# Patient Record
Sex: Male | Born: 1970
Health system: Southern US, Community
[De-identification: ages and names within clinical notes are randomized; demographics above are authoritative.]

## PROBLEM LIST (undated history)

## (undated) DIAGNOSIS — J302 Other seasonal allergic rhinitis: Secondary | ICD-10-CM

## (undated) DIAGNOSIS — K219 Gastro-esophageal reflux disease without esophagitis: Secondary | ICD-10-CM

## (undated) HISTORY — PX: HEMORRHOID SURGERY: SHX153

---

## 2007-12-21 ENCOUNTER — Encounter (INDEPENDENT_AMBULATORY_CARE_PROVIDER_SITE_OTHER): Payer: Self-pay | Admitting: General Surgery

## 2007-12-21 ENCOUNTER — Ambulatory Visit (HOSPITAL_COMMUNITY): Admission: RE | Admit: 2007-12-21 | Discharge: 2007-12-21 | Payer: Self-pay | Admitting: General Surgery

## 2010-07-29 NOTE — Op Note (Signed)
NAME:  Carl Zhang, Carl Zhang               ACCOUNT NO.:  [id number]   MEDICAL RECORD NO.:  [id number]          PATIENT TYPE:  AMB   LOCATION:  DAY                          FACILITY:  [facility]   PHYSICIAN:  Almond Lint, MD       DATE OF BIRTH:  [birth year]   DATE OF PROCEDURE:  12/21/2007  DATE OF DISCHARGE:                               OPERATIVE REPORT   PREOPERATIVE DIAGNOSIS:  External hemorrhoids.   POSTOPERATIVE DIAGNOSIS:  External hemorrhoids.   PROCEDURE PERFORMED:  Excision of external hemorrhoids x2 and excision  of rectal tag.   SURGEON:  Almond Lint, M.D.   ASSISTANT:  None.   ANESTHESIA:  General and local.   FINDINGS:  Two large external hemorrhoids and a rectal tag.   SPECIMENS:  Hemorrhoids and rectal tag, to pathology.   ESTIMATED BLOOD LOSS:  Minimal.   COMPLICATIONS:  None.   PROCEDURE:  [name] was identified in the holding area and taken to  the operating room, where he was placed supine on the operating room  table.  General anesthesia was induced.  He was placed in a lithotomy  position.  A time-out was performed with the surgical check list.  All  was correct, so we proceeded.  His anus was first examined digitally and  then with the anoscope.  There were minimal internal hemorrhoids but two  large external hemorrhoids and one small external hemorrhoid.  There is  also a rectal tag around 2 cm into the anus.  The hemorrhoids were  excised with the Bovie electrocautery.  Both defects were reasonably  good-sized, so these were closed with 3-0 chromic in a figure-of-eight  fashion.  The rectal tag was also removed with the Bovie.  This defect  was not closed, as it was small in size.  The area was found not to be  bleeding and was injected with local anesthesia.  This was 1% lidocaine  with epinephrine and 0.25% Marcaine plain mixed in a 1:1 fashion.  The  sphincter was anesthetized as well as under the sites of  hemorrhoidectomy.  A roll of Gelfoam with  lidocaine jelly was placed  inside the anus.  The anus was then cleaned, dried, and dressed with  gauze, ABDs, and mesh underwear.  Patient was awakened from anesthesia  and taken to the PACU in stable condition.     Almond Lint, MD  Electronically Signed    FB/MEDQ  D:  12/21/2007  T:  12/21/2007  Job:  [id number]

## 2015-05-13 ENCOUNTER — Other Ambulatory Visit: Payer: Self-pay | Admitting: Orthopedic Surgery

## 2015-05-17 ENCOUNTER — Encounter (HOSPITAL_BASED_OUTPATIENT_CLINIC_OR_DEPARTMENT_OTHER): Payer: Self-pay | Admitting: *Deleted

## 2015-05-20 NOTE — H&P (Signed)
Carl Zhang is an 45 y.o. male.   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  He went to see his primary care provider secondary to numbness and tingling that he was experiencing distal to the puncture wound.  He reports that this numbness and tingling makes it very difficult for him to feel the proper amount of pressure to apply when playing the cello.  He arrives today at the request of his primary care provider to further evaluate and treat his left small finger.  Past Medical History  Diagnosis Date  . Seasonal allergies     Past Surgical History  Procedure Laterality Date  . Hemorrhoid surgery      History reviewed. No pertinent family history. Social History:  reports that he has never smoked. He does not have any smokeless tobacco history on file. He reports that he does not drink alcohol or use illicit drugs.  Allergies: No Known Allergies  No prescriptions prior to admission    No results found for this or any previous visit (from the past 48 hour(s)). No results found.  Review of Systems  All other systems reviewed and are negative.   Height 6\' 3"  (1.905 m), weight 97.523 kg (215 lb). Physical Exam  Constitutional:  WD, WN, NAD HEENT:  NCAT, EOMI Neuro/Psych:  Alert & oriented to person, place, and time; appropriate mood & affect Lymphatic: No generalized UE edema or lymphadenopathy Extremities / MSK:  Both UE are normal with respect to appearance, ranges of motion, joint stability, muscle strength/tone, sensation, & perfusion except as otherwise noted:  There is an ulnar sided puncture wound just distal to the PIP with no signs of infection.  There is significant altered sensibility on the ulnar aspect of the left small finger, but none on the radial aspect.  2 point  discrimination on the radial aspect of the finger is 5 mm versus no ability to discriminate 2 points on the ulnar aspect.  The patient does have full digital range of motion.  Labs / Xrays: No radiographic studies obtained today.  Assessment:  Probable left small finger ulnar digital nerve laceration  Plan:  The findings were discussed at length with the patient.  After initially waiting several days, he ccontacted me indicating that the sensibility had not changed, and he wishes to proceed with surgical exploration and repair.  The details of the operative procedure were discussed with the patient.  Questions were invited and answered.  In addition to the goal of the procedure, the risks of the procedure to include but not limited to bleeding; infection; damage to the nerves or blood vessels that could result in bleeding, numbness, weakness, chronic pain, and the need for additional procedures; stiffness; the need for revision surgery; and anesthetic risks, were reviewed.  No specific outcome was guaranteed or implied.  Informed consent was obtained.   Ayani Ospina A., MD 05/20/2015, 1:27 PM

## 2015-05-21 ENCOUNTER — Ambulatory Visit (HOSPITAL_BASED_OUTPATIENT_CLINIC_OR_DEPARTMENT_OTHER)
Admission: RE | Admit: 2015-05-21 | Discharge: 2015-05-21 | Disposition: A | Discharge status: Home / Self Care | Payer: 59 | Source: Ambulatory Visit | Attending: Orthopedic Surgery | Admitting: Orthopedic Surgery

## 2015-05-21 ENCOUNTER — Ambulatory Visit (HOSPITAL_BASED_OUTPATIENT_CLINIC_OR_DEPARTMENT_OTHER): Payer: 59 | Admitting: Certified Registered"

## 2015-05-21 ENCOUNTER — Encounter (HOSPITAL_BASED_OUTPATIENT_CLINIC_OR_DEPARTMENT_OTHER): Payer: Self-pay | Admitting: Certified Registered"

## 2015-05-21 ENCOUNTER — Encounter (HOSPITAL_BASED_OUTPATIENT_CLINIC_OR_DEPARTMENT_OTHER)
Admission: RE | Disposition: A | Discharge status: Home / Self Care | Payer: Self-pay | Source: Ambulatory Visit | Attending: Orthopedic Surgery

## 2015-05-21 DIAGNOSIS — J302 Other seasonal allergic rhinitis: Secondary | ICD-10-CM | POA: Diagnosis not present

## 2015-05-21 DIAGNOSIS — S64497A Injury of digital nerve of left little finger, initial encounter: Secondary | ICD-10-CM | POA: Diagnosis present

## 2015-05-21 HISTORY — PX: NERVE REPAIR: SHX2083

## 2015-05-21 HISTORY — DX: Other seasonal allergic rhinitis: J30.2

## 2015-05-21 SURGERY — REPAIR, NERVE
Anesthesia: General | Site: Finger | Laterality: Left

## 2015-05-21 MED ORDER — BUPIVACAINE-EPINEPHRINE 0.5% -1:200000 IJ SOLN
INTRAMUSCULAR | Status: DC | PRN
  Administered 2015-05-21: 9 mL

## 2015-05-21 MED ORDER — ONDANSETRON HCL 4 MG/2ML IJ SOLN
INTRAMUSCULAR | Status: DC | PRN
  Administered 2015-05-21: 4 mg via INTRAVENOUS

## 2015-05-21 MED ORDER — LIDOCAINE HCL (CARDIAC) 20 MG/ML IV SOLN
INTRAVENOUS | Status: DC | PRN
  Administered 2015-05-21: 60 mg via INTRAVENOUS

## 2015-05-21 MED ORDER — ONDANSETRON HCL 4 MG/2ML IJ SOLN
INTRAMUSCULAR | Status: AC
  Filled 2015-05-21: qty 2

## 2015-05-21 MED ORDER — CEFAZOLIN SODIUM-DEXTROSE 2-3 GM-% IV SOLR
INTRAVENOUS | Status: AC
  Filled 2015-05-21: qty 50

## 2015-05-21 MED ORDER — GLYCOPYRROLATE 0.2 MG/ML IJ SOLN
0.2000 mg | Freq: Once | INTRAMUSCULAR | Status: DC | PRN
Start: 2015-05-21 — End: 2015-05-21

## 2015-05-21 MED ORDER — DEXAMETHASONE SODIUM PHOSPHATE 10 MG/ML IJ SOLN
INTRAMUSCULAR | Status: AC
  Filled 2015-05-21: qty 1

## 2015-05-21 MED ORDER — BUPIVACAINE-EPINEPHRINE (PF) 0.5% -1:200000 IJ SOLN
INTRAMUSCULAR | Status: AC
  Filled 2015-05-21: qty 30

## 2015-05-21 MED ORDER — CEFAZOLIN SODIUM-DEXTROSE 2-3 GM-% IV SOLR
2.0000 g | INTRAVENOUS | Status: AC
  Administered 2015-05-21: 2 g via INTRAVENOUS

## 2015-05-21 MED ORDER — FENTANYL CITRATE (PF) 100 MCG/2ML IJ SOLN
INTRAMUSCULAR | Status: AC
  Filled 2015-05-21: qty 2

## 2015-05-21 MED ORDER — FENTANYL CITRATE (PF) 100 MCG/2ML IJ SOLN
50.0000 ug | INTRAMUSCULAR | Status: DC | PRN
  Administered 2015-05-21: 100 ug via INTRAVENOUS

## 2015-05-21 MED ORDER — OXYCODONE HCL 5 MG/5ML PO SOLN: 5.0000 mg | Freq: Once | ORAL | Status: DC | PRN

## 2015-05-21 MED ORDER — LACTATED RINGERS IV SOLN: INTRAVENOUS | Status: DC

## 2015-05-21 MED ORDER — HYDROCODONE-ACETAMINOPHEN 5-325 MG PO TABS: 1.0000 | ORAL_TABLET | Freq: Four times a day (QID) | ORAL | Status: DC | PRN

## 2015-05-21 MED ORDER — LIDOCAINE HCL (CARDIAC) 20 MG/ML IV SOLN
INTRAVENOUS | Status: AC
  Filled 2015-05-21: qty 5

## 2015-05-21 MED ORDER — HYDROMORPHONE HCL 1 MG/ML IJ SOLN
0.2500 mg | INTRAMUSCULAR | Status: DC | PRN
  Administered 2015-05-21: 0.5 mg via INTRAVENOUS

## 2015-05-21 MED ORDER — MIDAZOLAM HCL 2 MG/2ML IJ SOLN
1.0000 mg | INTRAMUSCULAR | Status: DC | PRN
  Administered 2015-05-21: 2 mg via INTRAVENOUS

## 2015-05-21 MED ORDER — BUPIVACAINE HCL (PF) 0.5 % IJ SOLN
INTRAMUSCULAR | Status: AC
  Filled 2015-05-21: qty 30

## 2015-05-21 MED ORDER — MIDAZOLAM HCL 2 MG/2ML IJ SOLN
INTRAMUSCULAR | Status: AC
  Filled 2015-05-21: qty 2

## 2015-05-21 MED ORDER — DEXAMETHASONE SODIUM PHOSPHATE 10 MG/ML IJ SOLN
INTRAMUSCULAR | Status: DC | PRN
  Administered 2015-05-21: 10 mg via INTRAVENOUS

## 2015-05-21 MED ORDER — LACTATED RINGERS IV SOLN
INTRAVENOUS | Status: DC
  Administered 2015-05-21 (×2): via INTRAVENOUS

## 2015-05-21 MED ORDER — SCOPOLAMINE 1 MG/3DAYS TD PT72: 1.0000 | MEDICATED_PATCH | Freq: Once | TRANSDERMAL | Status: DC | PRN

## 2015-05-21 MED ORDER — OXYCODONE HCL 5 MG PO TABS: 5.0000 mg | ORAL_TABLET | Freq: Once | ORAL | Status: DC | PRN

## 2015-05-21 MED ORDER — MEPERIDINE HCL 25 MG/ML IJ SOLN: 6.2500 mg | INTRAMUSCULAR | Status: DC | PRN

## 2015-05-21 MED ORDER — HYDROMORPHONE HCL 1 MG/ML IJ SOLN
INTRAMUSCULAR | Status: AC
  Filled 2015-05-21: qty 1

## 2015-05-21 MED ORDER — PROPOFOL 10 MG/ML IV BOLUS
INTRAVENOUS | Status: DC | PRN
  Administered 2015-05-21: 300 mg via INTRAVENOUS

## 2015-05-21 SURGICAL SUPPLY — 66 items
BLADE MINI RND TIP GREEN BEAV (BLADE) IMPLANT
BLADE SURG 15 STRL LF DISP TIS (BLADE) ×1 IMPLANT
BLADE SURG 15 STRL SS (BLADE) ×2
BNDG COHESIVE 4X5 TAN STRL (GAUZE/BANDAGES/DRESSINGS) ×3 IMPLANT
BNDG ESMARK 4X9 LF (GAUZE/BANDAGES/DRESSINGS) ×3 IMPLANT
BNDG GAUZE ELAST 4 BULKY (GAUZE/BANDAGES/DRESSINGS) ×3 IMPLANT
CHLORAPREP W/TINT 26ML (MISCELLANEOUS) ×3 IMPLANT
CONNECTOR NERVE AXOGUARD 2X15 (Orthopedic Implant) ×3 IMPLANT
CORDS BIPOLAR (ELECTRODE) ×3 IMPLANT
COVER BACK TABLE 60X90IN (DRAPES) ×3 IMPLANT
COVER MAYO STAND STRL (DRAPES) ×3 IMPLANT
CUFF TOURNIQUET SINGLE 18IN (TOURNIQUET CUFF) ×3 IMPLANT
DECANTER SPIKE VIAL GLASS SM (MISCELLANEOUS) IMPLANT
DRAPE EXTREMITY T 121X128X90 (DRAPE) ×3 IMPLANT
DRAPE SURG 17X23 STRL (DRAPES) ×3 IMPLANT
DRSG EMULSION OIL 3X3 NADH (GAUZE/BANDAGES/DRESSINGS) ×3 IMPLANT
ELECT REM PT RETURN 9FT ADLT (ELECTROSURGICAL)
ELECTRODE REM PT RTRN 9FT ADLT (ELECTROSURGICAL) IMPLANT
GAUZE SPONGE 4X4 12PLY STRL (GAUZE/BANDAGES/DRESSINGS) ×3 IMPLANT
GLOVE BIO SURGEON STRL SZ 6.5 (GLOVE) ×2 IMPLANT
GLOVE BIO SURGEON STRL SZ7.5 (GLOVE) ×3 IMPLANT
GLOVE BIO SURGEONS STRL SZ 6.5 (GLOVE) ×1
GLOVE BIOGEL PI IND STRL 7.0 (GLOVE) ×2 IMPLANT
GLOVE BIOGEL PI IND STRL 8 (GLOVE) ×1 IMPLANT
GLOVE BIOGEL PI INDICATOR 7.0 (GLOVE) ×4
GLOVE BIOGEL PI INDICATOR 8 (GLOVE) ×2
GLOVE ECLIPSE 6.5 STRL STRAW (GLOVE) ×3 IMPLANT
GOWN STRL REUS W/ TWL LRG LVL3 (GOWN DISPOSABLE) ×2 IMPLANT
GOWN STRL REUS W/TWL LRG LVL3 (GOWN DISPOSABLE) ×4
GOWN STRL REUS W/TWL XL LVL3 (GOWN DISPOSABLE) ×3 IMPLANT
LOOP VESSEL MAXI BLUE (MISCELLANEOUS) IMPLANT
LOOP VESSEL MINI RED (MISCELLANEOUS) IMPLANT
NEEDLE HYPO 25X1 1.5 SAFETY (NEEDLE) ×3 IMPLANT
NS IRRIG 1000ML POUR BTL (IV SOLUTION) ×3 IMPLANT
PACK BASIN DAY SURGERY FS (CUSTOM PROCEDURE TRAY) ×3 IMPLANT
PADDING CAST ABS 3INX4YD NS (CAST SUPPLIES)
PADDING CAST ABS 4INX4YD NS (CAST SUPPLIES) ×2
PADDING CAST ABS COTTON 3X4 (CAST SUPPLIES) IMPLANT
PADDING CAST ABS COTTON 4X4 ST (CAST SUPPLIES) ×1 IMPLANT
PENCIL BUTTON HOLSTER BLD 10FT (ELECTRODE) IMPLANT
SLEEVE SCD COMPRESS KNEE MED (MISCELLANEOUS) ×3 IMPLANT
SLING ARM FOAM STRAP LRG (SOFTGOODS) IMPLANT
SPEAR EYE SURG WECK-CEL (MISCELLANEOUS) IMPLANT
SPLINT FAST PLASTER 5X30 (CAST SUPPLIES)
SPLINT PLASTER CAST FAST 5X30 (CAST SUPPLIES) IMPLANT
SPLINT PLASTER CAST XFAST 3X15 (CAST SUPPLIES) IMPLANT
SPLINT PLASTER XTRA FASTSET 3X (CAST SUPPLIES)
STOCKINETTE 6  STRL (DRAPES) ×2
STOCKINETTE 6 STRL (DRAPES) ×1 IMPLANT
SUT ETHIBOND 3-0 V-5 (SUTURE) IMPLANT
SUT ETHILON 8 0 BV130 4 (SUTURE) IMPLANT
SUT ETHILON 9 0 V 100.4 (SUTURE) IMPLANT
SUT FIBERWIRE 2-0 18 17.9 3/8 (SUTURE)
SUT NYLON 9 0 VRM6 (SUTURE) ×3 IMPLANT
SUT PROLENE 6 0 P 1 18 (SUTURE) IMPLANT
SUT SILK 4 0 PS 2 (SUTURE) IMPLANT
SUT STEEL 4 (SUTURE) IMPLANT
SUT VICRYL RAPIDE 4-0 (SUTURE) IMPLANT
SUT VICRYL RAPIDE 4/0 PS 2 (SUTURE) ×3 IMPLANT
SUTURE FIBERWR 2-0 18 17.9 3/8 (SUTURE) IMPLANT
SYR BULB 3OZ (MISCELLANEOUS) ×3 IMPLANT
SYRINGE 10CC LL (SYRINGE) ×3 IMPLANT
TOWEL OR 17X24 6PK STRL BLUE (TOWEL DISPOSABLE) ×3 IMPLANT
TUBE CONNECTING 20'X1/4 (TUBING)
TUBE CONNECTING 20X1/4 (TUBING) IMPLANT
UNDERPAD 30X30 (UNDERPADS AND DIAPERS) ×3 IMPLANT

## 2015-05-21 NOTE — Anesthesia Procedure Notes (Signed)
Procedure Name: LMA Insertion Date/Time: 05/21/2015 12:28 PM Performed by: Curly ShoresRAFT, Gizelle Whetsel W Pre-anesthesia Checklist: Patient identified, Emergency Drugs available, Suction available and Patient being monitored Patient Re-evaluated:Patient Re-evaluated prior to inductionOxygen Delivery Method: Circle System Utilized Preoxygenation: Pre-oxygenation with 100% oxygen Intubation Type: IV induction Ventilation: Mask ventilation without difficulty LMA: LMA inserted LMA Size: 5.0 Number of attempts: 1 Airway Equipment and Method: Bite block Placement Confirmation: positive ETCO2 and breath sounds checked- equal and bilateral Tube secured with: Tape Dental Injury: Teeth and Oropharynx as per pre-operative assessment

## 2015-05-21 NOTE — Interval H&P Note (Signed)
History and Physical Interval Note:  05/21/2015 12:17 PM  Carl Zhang  has presented today for surgery, with the diagnosis of LEFT SMALL FINGER ULNAR DIGITAL NERVE INJURY [phone]  The various methods of treatment have been discussed with the patient and family. After consideration of risks, benefits and other options for treatment, the patient has consented to  Procedure(s): LEFT SMALL FINGER DIGITAL NERVE REPAIR (Left) as a surgical intervention .  The patient's history has been reviewed, patient examined, no change in status, stable for surgery.  I have reviewed the patient's chart and labs.  Questions were answered to the patient's satisfaction.     Semaj Kham A.

## 2015-05-21 NOTE — Anesthesia Preprocedure Evaluation (Signed)

## 2015-05-21 NOTE — Op Note (Signed)
05/21/2015  12:22 PM  PATIENT:  Carl Zhang  45 y.o. male  PRE-OPERATIVE DIAGNOSIS:  Left SF UDN injury  POST-OPERATIVE DIAGNOSIS:  Same  PROCEDURE:  Repair of left small finger ulnar digital nerve primarily with Axogen conduit overwrap  SURGEON: Cliffton Astersavid A. Janee Mornhompson, MD  PHYSICIAN ASSISTANT: Danielle RankinKirsten Schrader, OPA-C  ANESTHESIA:  general  SPECIMENS:  None  DRAINS:   None  EBL:  less than 50 mL  PREOPERATIVE INDICATIONS:  Carl Zhang is a  45 y.o. male with history of a left small finger wound and markedly diminished sensibility in the ulnar side of the digit.  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  The risks benefits and alternatives were discussed with the patient preoperatively including but not limited to the risks of infection, bleeding, nerve injury, cardiopulmonary complications, the need for revision surgery, among others, and the patient verbalized understanding and consented to proceed.  OPERATIVE IMPLANTS: Axogen nerve connector, 2 mm, split and overwrapped  OPERATIVE PROCEDURE:  After receiving prophylactic antibiotics, the patient was escorted to the operative theatre and placed in a supine position.  A general anesthesia was administered.  surgical "time-out" was performed during which the planned procedure, proposed operative site, and the correct patient identity were compared to the operative consent and agreement confirmed by the circulating nurse according to current facility policy.  Following application of a tourniquet to the operative extremity, the exposed skin was prepped with Chloraprep and draped in the usual sterile fashion.  The limb was exsanguinated with an Esmarch bandage and the tourniquet inflated to approximately 100mmHg higher than systolic BP.  The previous ulnar-sided wound of the small finger was extended along the ulnar mid axial line proximally and distally to the adjacent IP flexion creases.   Proximally the incision was brought obliquely as if making a Bruner incision, but only for about half a centimeter.  Dissection was carried down to the level of the ulnar digital nerve were was found to be entrapped in scar at the site of injury, with at best 10% of the nerve still intact.  The scarred area was then excised, back to healthy-appearing pouty fascicles proximally and distally.  Direct primary neurorrhaphy was performed with 9-0 nylon epineurial sutures  3, with care not to over tighten the repair, but just to allow for light apposition of the ends.  With the digit held into full extension and the wrist in neutral, there was no appreciable gapping at the repair site.  A 2 mm Axogen nerve connector was then split longitudinally and used to overwrap the exposed segment of nerve, to augment the repair strength and enhance functional recovery.  This was secured with 3 different simple sutures, closing the wrap onto itself.  The tourniquet was released some additional hemostasis obtained, and half percent Marcaine with epinephrine instilled at the base of the digit for postoperative pain control as a digital block.  A bulky dressing was applied.  He was awakened and taken to room stable condition, breathing spontaneously  DISPOSITION: He'll be discharged home today with typical instructions, returning in 7-10 days, at which time we will likely remove the dressing allow him to begin to fully flex and extend the digit without additional protection.

## 2015-05-21 NOTE — Transfer of Care (Signed)
Immediate Anesthesia Transfer of Care Note  Patient: Carl Zhang  Procedure(s) Performed: Procedure(s): LEFT SMALL FINGER DIGITAL NERVE REPAIR (Left)  Patient Location: PACU  Anesthesia Type:General  Level of Consciousness: awake, sedated and responds to stimulation  Airway & Oxygen Therapy: Patient Spontanous Breathing and Patient connected to face mask oxygen  Post-op Assessment: Report given to RN, Post -op Vital signs reviewed and stable and Patient moving all extremities  Post vital signs: Reviewed and stable  Last Vitals:  Filed Vitals:   05/21/15 1036  BP: 140/90  Pulse: 70  Temp: 36.4 C  Resp: 20    Complications: No apparent anesthesia complications

## 2015-05-21 NOTE — Discharge Instructions (Signed)
Discharge Instructions   You have a dressing with a plaster splint incorporated in it. Move your fingers as much as possible, making a full fist and fully opening the fist. Elevate your hand to reduce pain & swelling of the digits.  Ice over the operative site may be helpful to reduce pain & swelling.  DO NOT USE HEAT. Pain medicine has been prescribed for you.  Use your medicine as needed over the first 48 hours, and then you can begin to taper your use.  You may use Tylenol in place of your prescribed pain medication, but not IN ADDITION to it. Leave the dressing in place until you return to our office.  You may shower, but keep the bandage clean & dry.  You may drive a car when you are off of prescription pain medications and can safely control your vehicle with both hands. Call our office to schedule a follow up visit for 7-10 days from date of surgery.   Please call [phone] during normal business hours or [phone] after hours for any problems. Including the following:  - excessive redness of the incisions - drainage for more than 4 days - fever of more than 101.5 F  *Please note that pain medications will not be refilled after hours or on weekends.    Post Anesthesia Home Care Instructions  Activity: Get plenty of rest for the remainder of the day. A responsible adult should stay with you for 24 hours following the procedure.  For the next 24 hours, DO NOT: -Drive a car -Advertising copywriterperate machinery -Drink alcoholic beverages -Take any medication unless instructed by your physician -Make any legal decisions or sign important papers.  Meals: Start with liquid foods such as gelatin or soup. Progress to regular foods as tolerated. Avoid greasy, spicy, heavy foods. If nausea and/or vomiting occur, drink only clear liquids until the nausea and/or vomiting subsides. Call your physician if vomiting continues.  Special Instructions/Symptoms: Your throat may feel dry or sore from the  anesthesia or the breathing tube placed in your throat during surgery. If this causes discomfort, gargle with warm salt water. The discomfort should disappear within 24 hours.  If you had a scopolamine patch placed behind your ear for the management of post- operative nausea and/or vomiting:  1. The medication in the patch is effective for 72 hours, after which it should be removed.  Wrap patch in a tissue and discard in the trash. Wash hands thoroughly with soap and water. 2. You may remove the patch earlier than 72 hours if you experience unpleasant side effects which may include dry mouth, dizziness or visual disturbances. 3. Avoid touching the patch. Wash your hands with soap and water after contact with the patch.

## 2015-05-22 ENCOUNTER — Encounter (HOSPITAL_BASED_OUTPATIENT_CLINIC_OR_DEPARTMENT_OTHER): Payer: Self-pay | Admitting: Orthopedic Surgery

## 2015-05-22 NOTE — Anesthesia Postprocedure Evaluation (Signed)
Anesthesia Post Note  Patient: Solon Palmhomas R Chachere  Procedure(s) Performed: Procedure(s) (LRB): LEFT SMALL FINGER DIGITAL NERVE REPAIR (Left)  Patient location during evaluation: PACU Anesthesia Type: General Level of consciousness: awake and alert Pain management: pain level controlled Vital Signs Assessment: post-procedure vital signs reviewed and stable Respiratory status: spontaneous breathing, nonlabored ventilation and respiratory function stable Cardiovascular status: blood pressure returned to baseline and stable Postop Assessment: no signs of nausea or vomiting Anesthetic complications: no    Last Vitals:  Filed Vitals:   05/21/15 1415 05/21/15 1500  BP: 131/92 125/90  Pulse: 74 72  Temp:  36.4 C  Resp: 10 14    Last Pain:  Filed Vitals:   05/22/15 1059  PainSc: 1                  Shakeda Pearse A

## 2016-04-13 DIAGNOSIS — Z719 Counseling, unspecified: Secondary | ICD-10-CM | POA: Diagnosis not present

## 2016-04-20 DIAGNOSIS — Z719 Counseling, unspecified: Secondary | ICD-10-CM | POA: Diagnosis not present

## 2016-04-27 DIAGNOSIS — Z719 Counseling, unspecified: Secondary | ICD-10-CM | POA: Diagnosis not present

## 2016-05-04 DIAGNOSIS — Z719 Counseling, unspecified: Secondary | ICD-10-CM | POA: Diagnosis not present

## 2016-05-11 DIAGNOSIS — Z719 Counseling, unspecified: Secondary | ICD-10-CM | POA: Diagnosis not present

## 2016-05-18 DIAGNOSIS — Z719 Counseling, unspecified: Secondary | ICD-10-CM | POA: Diagnosis not present

## 2016-05-25 DIAGNOSIS — Z719 Counseling, unspecified: Secondary | ICD-10-CM | POA: Diagnosis not present

## 2016-06-01 DIAGNOSIS — Z719 Counseling, unspecified: Secondary | ICD-10-CM | POA: Diagnosis not present

## 2016-06-08 DIAGNOSIS — Z719 Counseling, unspecified: Secondary | ICD-10-CM | POA: Diagnosis not present

## 2016-06-15 DIAGNOSIS — Z719 Counseling, unspecified: Secondary | ICD-10-CM | POA: Diagnosis not present

## 2016-06-29 DIAGNOSIS — Z719 Counseling, unspecified: Secondary | ICD-10-CM | POA: Diagnosis not present

## 2016-07-06 DIAGNOSIS — Z719 Counseling, unspecified: Secondary | ICD-10-CM | POA: Diagnosis not present

## 2016-07-20 DIAGNOSIS — Z719 Counseling, unspecified: Secondary | ICD-10-CM | POA: Diagnosis not present

## 2017-06-28 DIAGNOSIS — Z13220 Encounter for screening for lipoid disorders: Secondary | ICD-10-CM | POA: Diagnosis not present

## 2017-06-28 DIAGNOSIS — R1013 Epigastric pain: Secondary | ICD-10-CM | POA: Diagnosis not present

## 2017-06-28 DIAGNOSIS — Z0000 Encounter for general adult medical examination without abnormal findings: Secondary | ICD-10-CM | POA: Diagnosis not present

## 2017-06-29 ENCOUNTER — Other Ambulatory Visit: Payer: Self-pay | Admitting: Family Medicine

## 2017-06-29 DIAGNOSIS — R1013 Epigastric pain: Secondary | ICD-10-CM

## 2017-07-08 ENCOUNTER — Ambulatory Visit
Admission: RE | Admit: 2017-07-08 | Discharge: 2017-07-08 | Disposition: A | Discharge status: Home / Self Care | Payer: 59 | Source: Ambulatory Visit | Attending: Family Medicine | Admitting: Family Medicine

## 2017-07-08 DIAGNOSIS — K760 Fatty (change of) liver, not elsewhere classified: Secondary | ICD-10-CM | POA: Diagnosis not present

## 2017-07-08 DIAGNOSIS — R1013 Epigastric pain: Secondary | ICD-10-CM

## 2017-07-19 DIAGNOSIS — Z0100 Encounter for examination of eyes and vision without abnormal findings: Secondary | ICD-10-CM | POA: Diagnosis not present

## 2017-10-12 DIAGNOSIS — R030 Elevated blood-pressure reading, without diagnosis of hypertension: Secondary | ICD-10-CM | POA: Diagnosis not present

## 2017-10-12 DIAGNOSIS — K210 Gastro-esophageal reflux disease with esophagitis: Secondary | ICD-10-CM | POA: Diagnosis not present

## 2017-12-28 DIAGNOSIS — Z23 Encounter for immunization: Secondary | ICD-10-CM | POA: Diagnosis not present

## 2018-04-14 DIAGNOSIS — K210 Gastro-esophageal reflux disease with esophagitis: Secondary | ICD-10-CM | POA: Diagnosis not present

## 2018-04-14 DIAGNOSIS — R030 Elevated blood-pressure reading, without diagnosis of hypertension: Secondary | ICD-10-CM | POA: Diagnosis not present

## 2018-05-31 IMAGING — US US ABDOMEN LIMITED
1 series · 14 of 25 positions shown · non-contrast
Comparison: None.

CLINICAL DATA: Epigastric pain

EXAM:
ULTRASOUND ABDOMEN LIMITED RIGHT UPPER QUADRANT

[Series 1: us abdomen limited · 0.25mm/px · 14 of 42 slices shown]
[im 1/42]
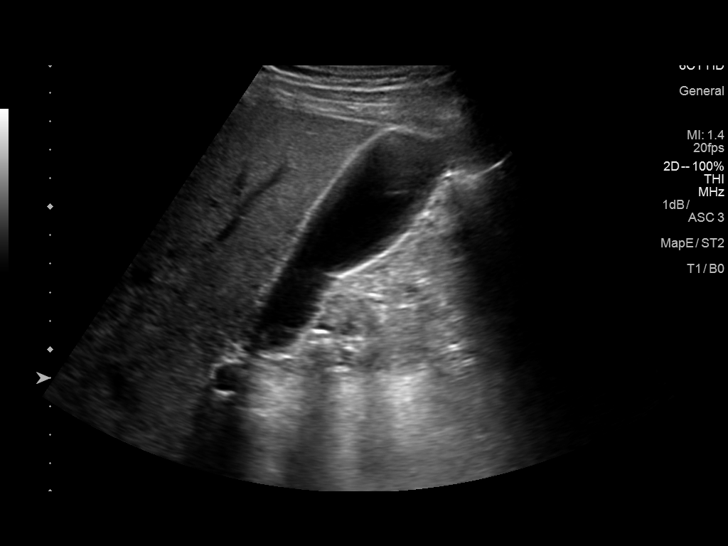
[im 4/42]
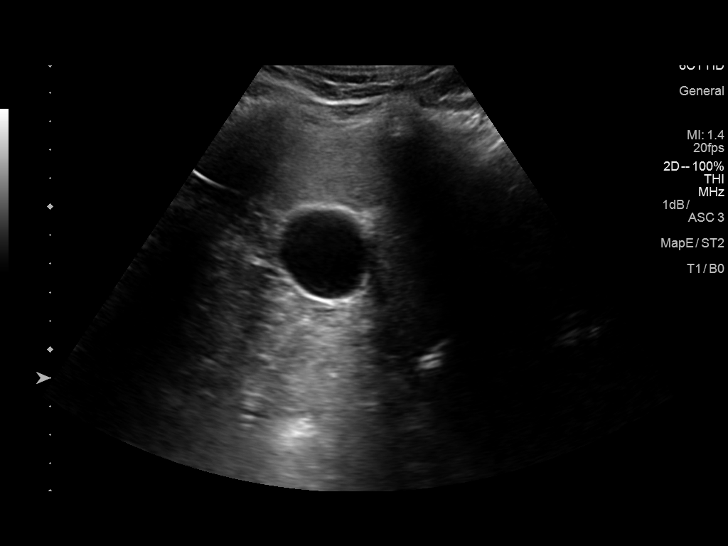
[im 7/42]
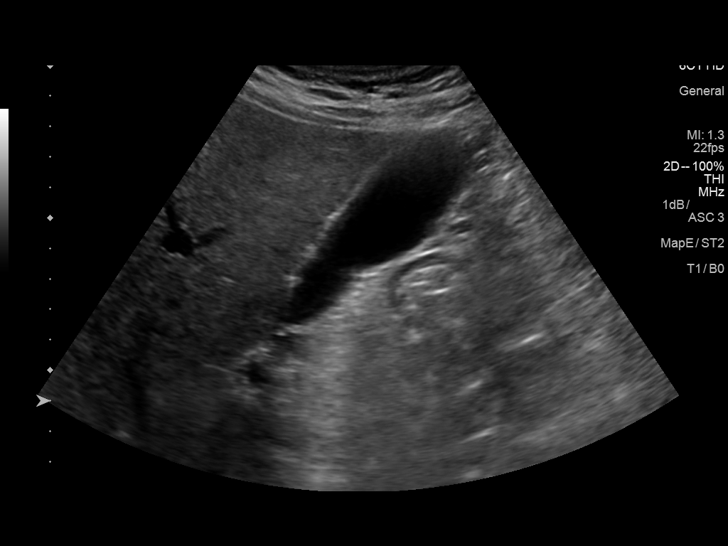
[im 11/42]
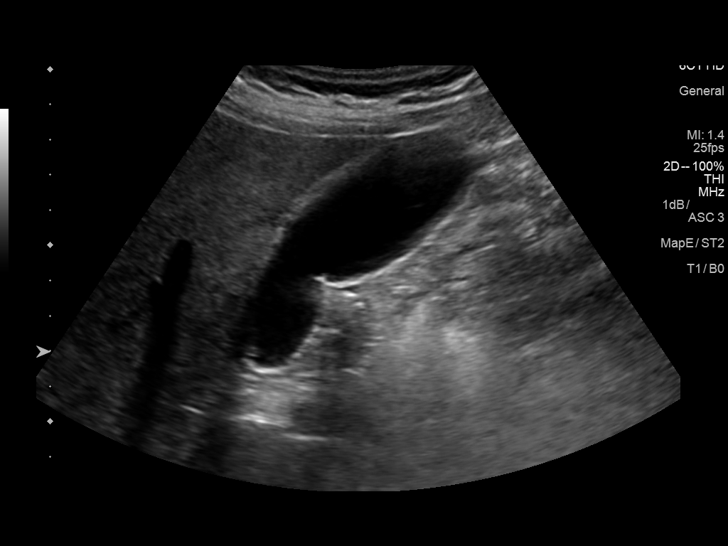
[im 14/42]
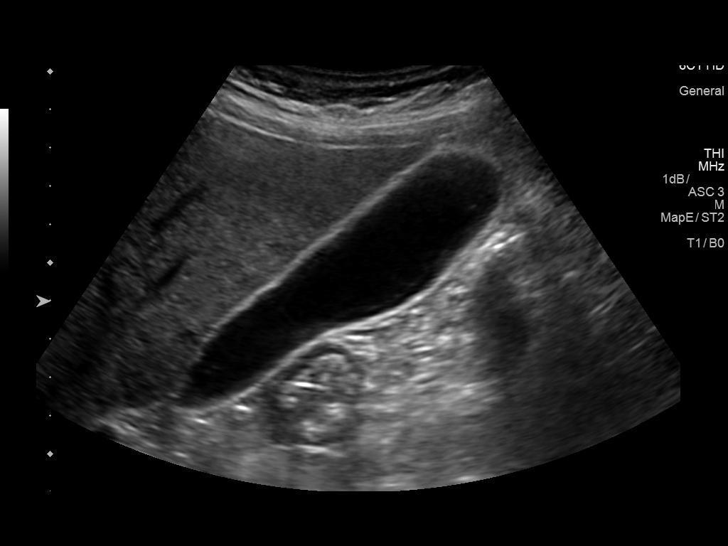
[im 16/42]
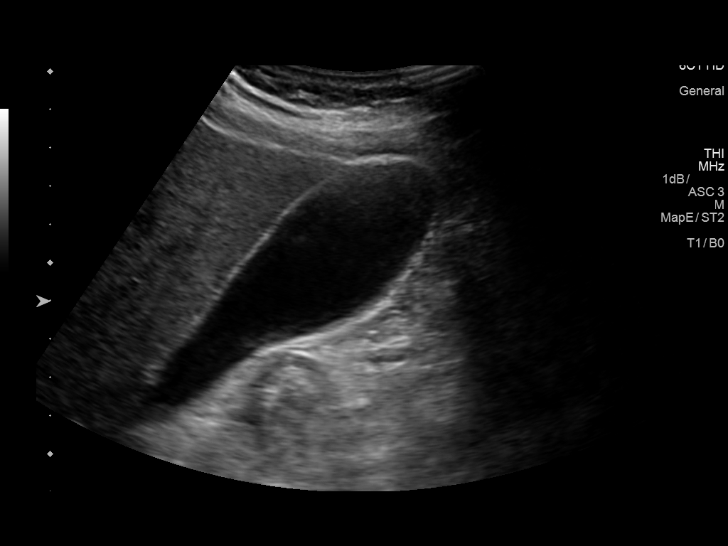
[im 19/42]
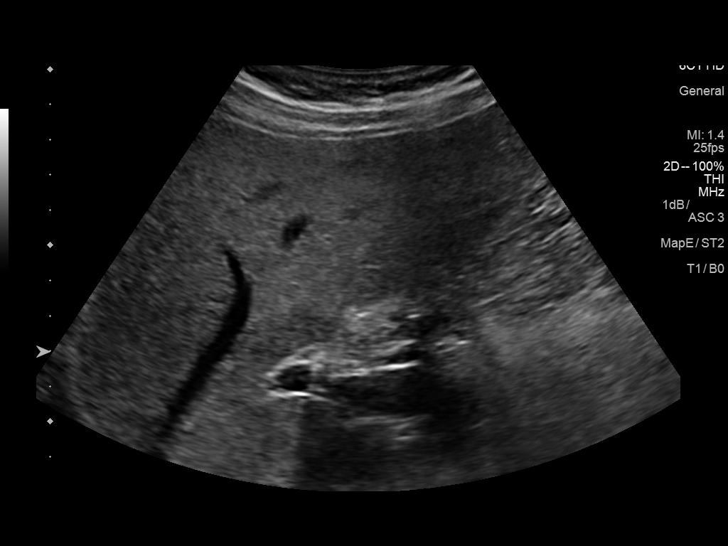
[im 23/42]
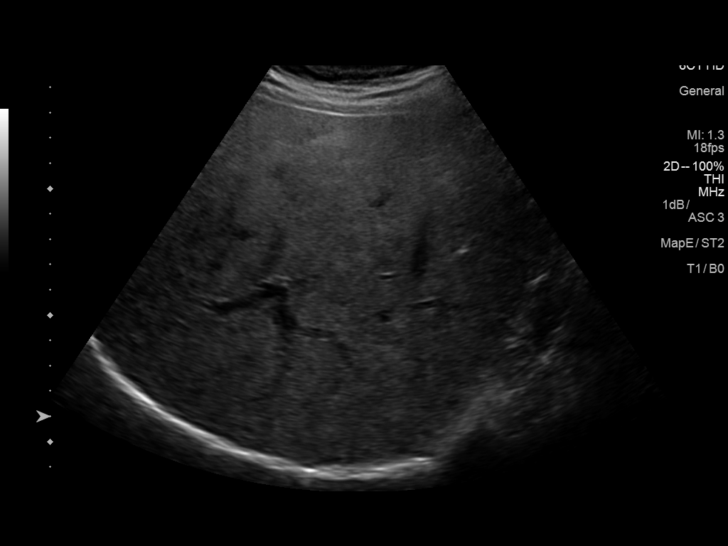
[im 26/42]
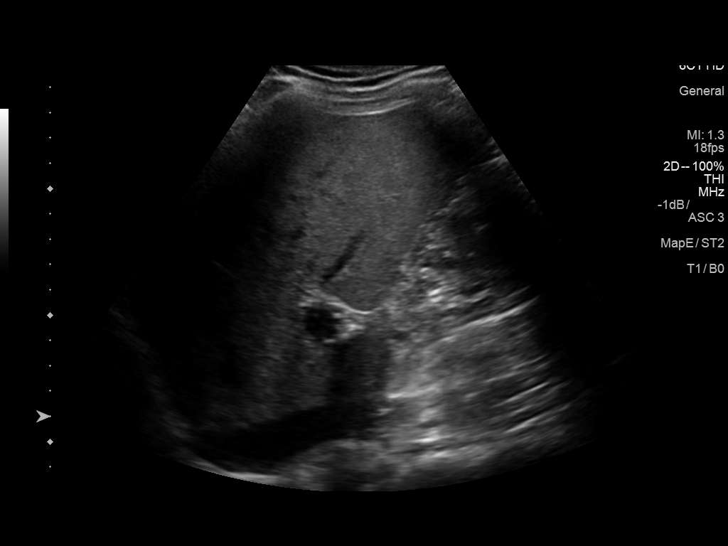
[im 28/42]
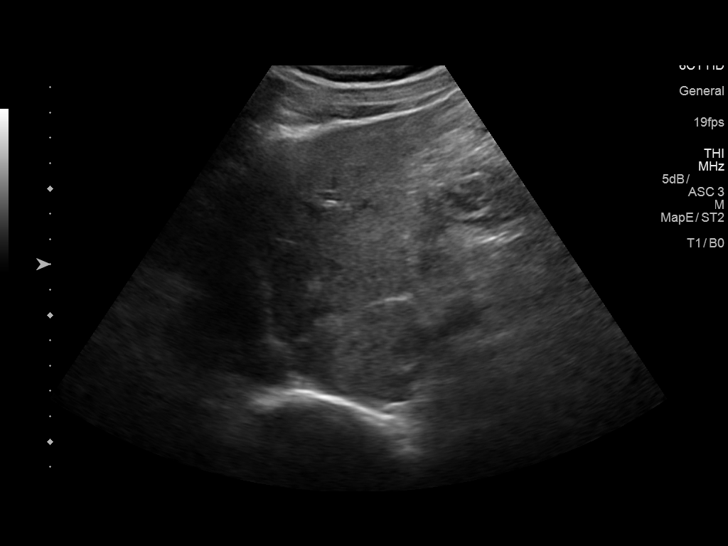
[im 31/42]
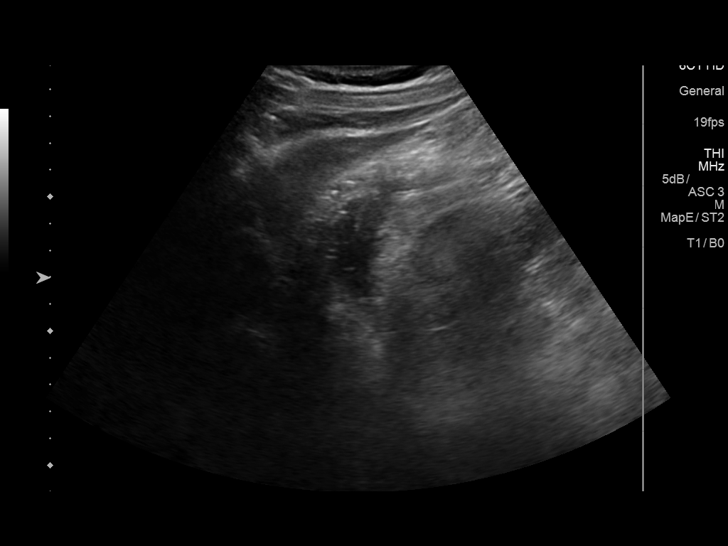
[im 35/42]
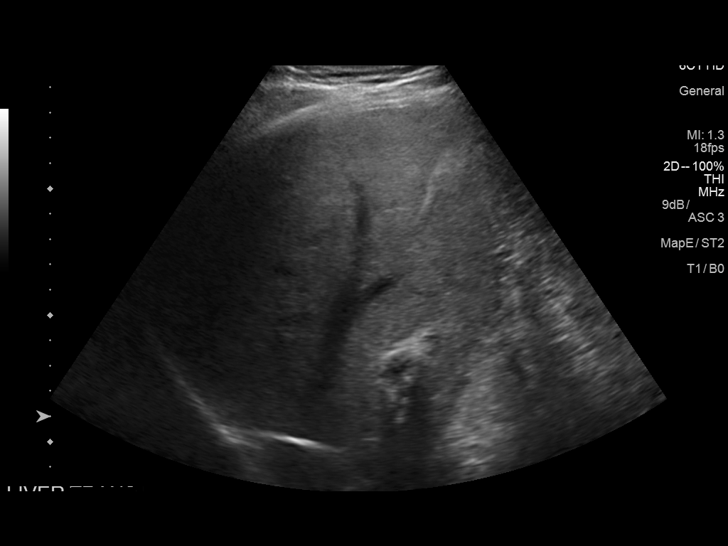
[im 38/42]
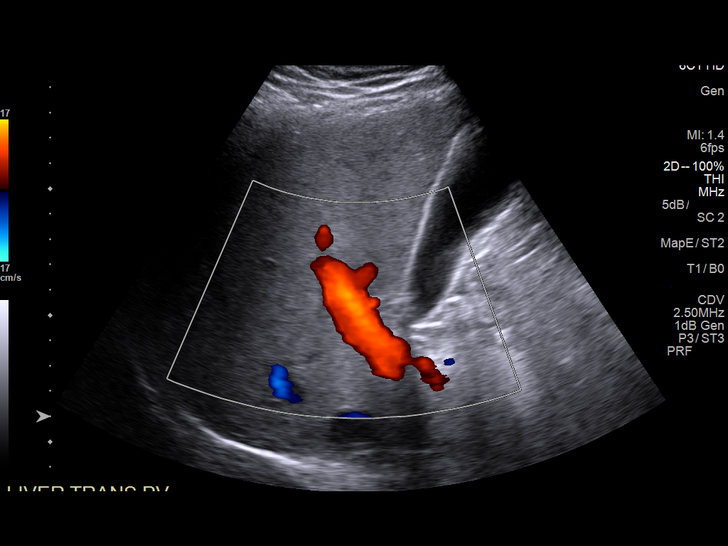
[im 42/42]
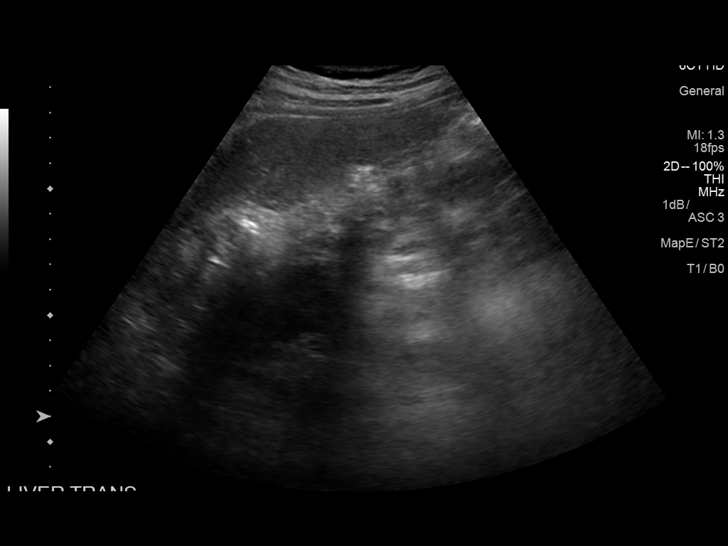

[14 of 25 positions shown; findings below may reference images not displayed]

FINDINGS: Gallbladder:

No gallstones or wall thickening visualized. No sonographic Murphy
sign noted by sonographer.

Common bile duct:

Diameter: 2.5 mm.

Liver:

Mild increased echogenicity is noted consistent with fatty
infiltration. No focal mass lesion is noted. Portal vein is patent
on color Doppler imaging with normal direction of blood flow towards
the liver.
IMPRESSION: Fatty liver

## 2018-12-19 ENCOUNTER — Other Ambulatory Visit: Payer: Self-pay

## 2018-12-19 DIAGNOSIS — Z20822 Contact with and (suspected) exposure to covid-19: Secondary | ICD-10-CM

## 2018-12-22 LAB — NOVEL CORONAVIRUS, NAA: SARS-CoV-2, NAA: NOT DETECTED

## 2019-12-12 ENCOUNTER — Other Ambulatory Visit: Payer: Self-pay

## 2019-12-12 ENCOUNTER — Encounter: Payer: Self-pay | Admitting: Podiatry

## 2019-12-12 ENCOUNTER — Ambulatory Visit (INDEPENDENT_AMBULATORY_CARE_PROVIDER_SITE_OTHER): Payer: 59 | Admitting: Podiatry

## 2019-12-12 DIAGNOSIS — L603 Nail dystrophy: Secondary | ICD-10-CM

## 2019-12-12 NOTE — Progress Notes (Signed)
°  Subjective:  Patient ID: Carl Zhang, male    DOB: 1970-07-22,  MRN: [id number] HPI Chief Complaint  Patient presents with   Nail Problem    Hallux right - toenail thick and discolored x years, had ingrown procedure as a teenager and hasn't grown right since, no discomfort    49 y.o. male presents with the above complaint.   ROS: Denies fever chills nausea vomiting muscle aches pains calf pain back pain chest pain shortness of breath.  Past Medical History:  Diagnosis Date   Seasonal allergies    Past Surgical History:  Procedure Laterality Date   HEMORRHOID SURGERY     NERVE REPAIR Left 05/21/2015   Procedure: LEFT SMALL FINGER DIGITAL NERVE REPAIR;  Surgeon: Mack Hook, MD;  Location: [facility];  Service: Orthopedics;  Laterality: Left;    Current Outpatient Medications:    cetirizine (ZYRTEC) 10 MG tablet, Take 10 mg by mouth daily., Disp: , Rfl:    fluticasone (FLONASE) 50 MCG/ACT nasal spray, Place into both nostrils daily., Disp: , Rfl:    HYDROcodone-acetaminophen (NORCO) 5-325 MG tablet, Take 1-2 tablets by mouth every 6 (six) hours as needed for moderate pain., Disp: 60 tablet, Rfl: 0  No Known Allergies Review of Systems Objective:  There were no vitals filed for this visit.  General: Well developed, nourished, in no acute distress, alert and oriented x3   Dermatological: Skin is warm, dry and supple bilateral. Nails x 10 are well maintained.  Hallux bilateral and the third digit left do demonstrate what appears to be nail dystrophy with thickening of the nail plate and subungual debris.; remaining integument appears unremarkable at this time. There are no open sores, no preulcerative lesions, no rash or signs of infection present.  Vascular: Dorsalis Pedis artery and Posterior Tibial artery pedal pulses are 2/4 bilateral with immedate capillary fill time. Pedal hair growth present. No varicosities and no lower extremity edema present  bilateral.   Neruologic: Grossly intact via light touch bilateral. Vibratory intact via tuning fork bilateral. Protective threshold with Semmes Wienstein monofilament intact to all pedal sites bilateral. Patellar and Achilles deep tendon reflexes 2+ bilateral. No Babinski or clonus noted bilateral.   Musculoskeletal: No gross boney pedal deformities bilateral. No pain, crepitus, or limitation noted with foot and ankle range of motion bilateral. Muscular strength 5/5 in all groups tested bilateral.  Gait: Unassisted, Nonantalgic.    Radiographs:  None taken  Assessment & Plan:   Assessment: Nail dystrophy cannot rule out onychomycosis hallux bilateral right greater than left third digit left  Plan: Samples of the skin and nail were taken today to be sent for pathologic evaluation we discussed the possibilities of laser and oral therapy today.  We also discussed the possible removal of the hallux nail right since it appears to be thicker and more dystrophic and more problematic.     Jamariya Davidoff T. [place], [place]

## 2020-01-09 ENCOUNTER — Encounter: Payer: Self-pay | Admitting: Podiatry

## 2020-01-09 ENCOUNTER — Other Ambulatory Visit: Payer: Self-pay

## 2020-01-09 ENCOUNTER — Ambulatory Visit (INDEPENDENT_AMBULATORY_CARE_PROVIDER_SITE_OTHER): Payer: 59 | Admitting: Podiatry

## 2020-01-09 DIAGNOSIS — L603 Nail dystrophy: Secondary | ICD-10-CM | POA: Diagnosis not present

## 2020-01-09 DIAGNOSIS — Z79899 Other long term (current) drug therapy: Secondary | ICD-10-CM

## 2020-01-09 MED ORDER — TERBINAFINE HCL 250 MG PO TABS: 250.0000 mg | ORAL_TABLET | Freq: Every day | ORAL | 0 refills | Status: DC

## 2020-01-09 NOTE — Progress Notes (Signed)
He presents today for follow-up of his nail pathology.  Objective: No pathology was determined to be Trichophyton rubrum with no nail dystrophy.  Assessment: T rubrum with no nail dystrophy.  Plan: At this point I recommended that we start oral therapy after discussing topical and laser therapy. We did discuss pros and cons of use of oral therapy. He understands this that this may not correct his deformity of the nail that he is concerned about though I did express to him that it may help with the discomfort and soreness and help prevent it from growing in.  He is recently had blood work done and he is going get [place] a copy of that. I will go ahead and start him on 30 tablets of Lamisil 1 tablet every day for 30 days and I will follow-up with him in 1 month at that time we will provide him with a requisition for blood work and 90-day prescription.

## 2020-01-09 NOTE — Patient Instructions (Signed)
Terbinafine tablets What is this medicine? TERBINAFINE (TER bin a feen) is an antifungal medicine. It is used to treat certain kinds of fungal or yeast infections. This medicine may be used for other purposes; ask your health care provider or pharmacist if you have questions. COMMON BRAND NAME(S): Lamisil, Terbinex What should I tell my health care provider before I take this medicine? They need to know if you have any of these conditions:  drink alcoholic beverages  kidney disease  liver disease  an unusual or allergic reaction to terbinafine, other medicines, foods, dyes, or preservatives  pregnant or trying to get pregnant  breast-feeding How should I use this medicine? Take this medicine by mouth with a full glass of water. Follow the directions on the prescription label. You can take this medicine with food or on an empty stomach. Take your medicine at regular intervals. Do not take your medicine more often than directed. Do not skip doses or stop your medicine early even if you feel better. Do not stop taking except on your doctor's advice. Talk to your pediatrician regarding the use of this medicine in children. Special care may be needed. Overdosage: If you think you have taken too much of this medicine contact a poison control center or emergency room at once. NOTE: This medicine is only for you. Do not share this medicine with others. What if I miss a dose? If you miss a dose, take it as soon as you can. If it is almost time for your next dose, take only that dose. Do not take double or extra doses. What may interact with this medicine? Do not take this medicine with any of the following medications:  thioridazine This medicine may also interact with the following medications:  beta-blockers  caffeine  cimetidine  cyclosporine  medicines for depression, anxiety, or psychotic disturbances  medicines for fungal infections like fluconazole and ketoconazole  medicines  for irregular heartbeat like amiodarone, flecainide and propafenone  rifampin  warfarin This list may not describe all possible interactions. Give your health care provider a list of all the medicines, herbs, non-prescription drugs, or dietary supplements you use. Also tell them if you smoke, drink alcohol, or use illegal drugs. Some items may interact with your medicine. What should I watch for while using this medicine? Visit your doctor or health care provider regularly. Tell your doctor right away if you have nausea or vomiting, loss of appetite, stomach pain on your right upper side, yellow skin, dark urine, light stools, or are over tired. Some fungal infections need many weeks or months of treatment to cure. If you are taking this medicine for a long time, you will need to have important blood work done. This medicine may cause serious skin reactions. They can happen weeks to months after starting the medicine. Contact your health care provider right away if you notice fevers or flu-like symptoms with a rash. The rash may be red or purple and then turn into blisters or peeling of the skin. Or, you might notice a red rash with swelling of the face, lips or lymph nodes in your neck or under your arms. What side effects may I notice from receiving this medicine? Side effects that you should report to your doctor or health care professional as soon as possible:  allergic reactions like skin rash or hives, swelling of the face, lips, or tongue  changes in vision  dark urine  fever or infection  general ill feeling or flu-like symptoms    light-colored stools  loss of appetite, nausea  rash, fever, and swollen lymph nodes  redness, blistering, peeling or loosening of the skin, including inside the mouth  right upper belly pain  unusually weak or tired  yellowing of the eyes or skin Side effects that usually do not require medical attention (report to your doctor or health care  professional if they continue or are bothersome):  changes in taste  diarrhea  hair loss  muscle or joint pain  stomach gas  stomach upset This list may not describe all possible side effects. Call your doctor for medical advice about side effects. You may report side effects to FDA at 1-800-FDA-1088. Where should I keep my medicine? Keep out of the reach of children. Store at room temperature below 25 degrees C (77 degrees F). Protect from light. Throw away any unused medicine after the expiration date. NOTE: This sheet is a summary. It may not cover all possible information. If you have questions about this medicine, talk to your doctor, pharmacist, or health care provider.  2020 [employer] Standard (2018-06-10 15:37:07)  

## 2020-02-13 ENCOUNTER — Other Ambulatory Visit: Payer: Self-pay

## 2020-02-13 ENCOUNTER — Encounter: Payer: Self-pay | Admitting: Podiatry

## 2020-02-13 ENCOUNTER — Ambulatory Visit (INDEPENDENT_AMBULATORY_CARE_PROVIDER_SITE_OTHER): Payer: 59 | Admitting: Podiatry

## 2020-02-13 DIAGNOSIS — L603 Nail dystrophy: Secondary | ICD-10-CM | POA: Diagnosis not present

## 2020-02-13 MED ORDER — TERBINAFINE HCL 250 MG PO TABS: 250.0000 mg | ORAL_TABLET | Freq: Every day | ORAL | 0 refills | Status: DC

## 2020-02-13 NOTE — Progress Notes (Signed)
He presents today having completed or nearly completed his Lamisil therapy.  States he has 7 to 10 pills left.  Denies any side effects noticeable.  Denies fever chills nausea vomit muscle aches pains calf pain back pain chest pain shortness of breath or itching or rash.  States that he can tell a difference in his left hallux nail plate.  Assessment long-term medication Lamisil for onychomycosis.  Plan: Requested liver profile today and went ahead and prescribed another 90 pills.  Follow-up with him in 4 months should the liver were come back abnormal I will notify him or his primary will notify us as soon as possible.

## 2020-02-24 ENCOUNTER — Encounter: Payer: Self-pay | Admitting: Podiatry

## 2020-06-13 ENCOUNTER — Other Ambulatory Visit: Payer: Self-pay

## 2020-06-13 ENCOUNTER — Ambulatory Visit (INDEPENDENT_AMBULATORY_CARE_PROVIDER_SITE_OTHER): Payer: 59 | Admitting: Podiatry

## 2020-06-13 DIAGNOSIS — Z79899 Other long term (current) drug therapy: Secondary | ICD-10-CM | POA: Diagnosis not present

## 2020-06-13 MED ORDER — TERBINAFINE HCL 250 MG PO TABS: 250.0000 mg | ORAL_TABLET | Freq: Every day | ORAL | 0 refills | Status: DC

## 2020-06-13 NOTE — Progress Notes (Signed)
He presents today for follow-up of his Lamisil.  He states that he is doing much better particularly on the left not as much on the right.  Denies any problems taking the medication.  Objective: Vital signs are stable alert oriented x3.  Nails appear to be healing particularly the hallux left nail dystrophy to the right is not doing as much.  Assessment: Well-healing onychomycosis long-term therapy with Lamisil.  Plan: At this point we will start him on Lamisil 250 mg tablets #31 p.o. every other day and I will follow-up with him in 3 months.

## 2020-09-12 ENCOUNTER — Ambulatory Visit (INDEPENDENT_AMBULATORY_CARE_PROVIDER_SITE_OTHER): Payer: 59 | Admitting: Podiatry

## 2020-09-12 ENCOUNTER — Other Ambulatory Visit: Payer: Self-pay

## 2020-09-12 DIAGNOSIS — L603 Nail dystrophy: Secondary | ICD-10-CM

## 2020-09-12 MED ORDER — TERBINAFINE HCL 250 MG PO TABS: 250.0000 mg | ORAL_TABLET | Freq: Every day | ORAL | 0 refills | Status: DC

## 2020-09-12 NOTE — Progress Notes (Signed)
He presents today for follow-up of his Lamisil.  He denies fever chills nausea vomiting muscle aches pains calf pain back pain chest pain shortness of breath itching or rashes.  States that his toenail hallux left is doing very well the right one is slower but does appear to be thinner than it was in the proximal nail color is brighter.  Objective: Vital signs are stable he is alert oriented x3 objective evaluation also demonstrates a well-healing onychomycosis with Lamisil hallux bilateral.  Assessment: Long-term therapy with Lamisil.  Plan: I will start him on his second dose of every other day Lamisil.  Should he have questions or concerns I would like to follow-up with him in 3 months.

## 2020-12-17 ENCOUNTER — Encounter: Payer: Self-pay | Admitting: Podiatry

## 2020-12-17 ENCOUNTER — Other Ambulatory Visit: Payer: Self-pay

## 2020-12-17 ENCOUNTER — Ambulatory Visit (INDEPENDENT_AMBULATORY_CARE_PROVIDER_SITE_OTHER): Payer: 59 | Admitting: Podiatry

## 2020-12-17 DIAGNOSIS — L603 Nail dystrophy: Secondary | ICD-10-CM | POA: Diagnosis not present

## 2020-12-17 MED ORDER — TERBINAFINE HCL 250 MG PO TABS: 250.0000 mg | ORAL_TABLET | Freq: Every day | ORAL | 0 refills | Status: DC

## 2020-12-18 NOTE — Progress Notes (Signed)
He presents today having completed his second dose of every other day with Lamisil.  He states that "it is getting there" just very slow as he refers to the hallux right primarily.  He also is concerned about a white streak that just appeared on the hallux left tibial border.  Objective: Vital signs are stable alert oriented x3.  He has nail dystrophy to the hallux right which is demonstrated by taking a long time to grow out but there is obviously a line of clearing is not Offending but a lot of clearing that is present on the hallux nail plate right.  The hallux left does demonstrate an area of white superficial onychomycosis but also demonstrates ingrown nails.  Assessment: Ingrown nail hallux left with white superficial onychomycosis as well as nail dystrophy hallux right.  Plan: At this point I want start him back on his Lamisil for his third every other day dose and then we will consider in 3 months for med check as well as a matrixectomy to the left hallux.

## 2021-03-20 ENCOUNTER — Ambulatory Visit (INDEPENDENT_AMBULATORY_CARE_PROVIDER_SITE_OTHER): Payer: 59 | Admitting: Podiatry

## 2021-03-20 ENCOUNTER — Encounter: Payer: Self-pay | Admitting: Podiatry

## 2021-03-20 ENCOUNTER — Other Ambulatory Visit: Payer: Self-pay

## 2021-03-20 DIAGNOSIS — L603 Nail dystrophy: Secondary | ICD-10-CM | POA: Diagnosis not present

## 2021-03-20 NOTE — Progress Notes (Signed)
He presents today for his third every other day dose of Lamisil.  States that the big nail on the left foot looks worse rather than better there is this white stuff all over the top of it.  Objective: Vital signs are stable alert and oriented x3.  Pulses are palpable.  He does have a superficial white onychomycosis to the hallux left.  Assessment: Chronic nail dystrophy with onychomycosis currently superficial white onychomycosis left hallux.  Plan: Samples of this today to be sent for pathologic evaluation make sure his onychomycosis and not yeast.  Topical probably be best for this hallux.

## 2021-03-31 ENCOUNTER — Encounter: Payer: Self-pay | Admitting: Podiatry

## 2021-04-01 NOTE — Progress Notes (Signed)
Called patient for results,no answer, left vmessage for call back for those results

## 2021-04-07 NOTE — Progress Notes (Signed)
Patient has been notified of nail fungus results,verbalized understanding and  has an upcoming appointment in Feb.

## 2021-04-24 ENCOUNTER — Ambulatory Visit: Payer: 59 | Admitting: Podiatry

## 2021-05-08 ENCOUNTER — Other Ambulatory Visit: Payer: Self-pay

## 2021-05-08 ENCOUNTER — Ambulatory Visit (INDEPENDENT_AMBULATORY_CARE_PROVIDER_SITE_OTHER): Payer: 59 | Admitting: Podiatry

## 2021-05-08 DIAGNOSIS — L603 Nail dystrophy: Secondary | ICD-10-CM

## 2021-05-10 NOTE — Progress Notes (Signed)
He presents today for follow-up of his Lamisil therapy.  He states he just does not think that is improving enough to constitute him continuing the Lamisil.  He states that this seems to be getting worse and the nail looks like it is going slow to come off.  Objective: Vital signs are stable he is alert and oriented x3 he does have worsening of the nail with what appears to be a superficial white onychomycosis or a yeast infection.  There also does demonstrate some onycholysis.  Assessment: Distal onycholysis topical or superficial fungal infection or yeast infection appears to be the cause.  Plan: Discontinue oral therapy however I did recommend that he try formula 7 which we sell here in the office and I explained to him how to keep the nails cut short and filed thin.  We did briefly discussed possibly removing the nail in total and allow for regrowth.

## 2022-03-06 DIAGNOSIS — Z9889 Other specified postprocedural states: Secondary | ICD-10-CM

## 2022-03-06 HISTORY — DX: Other specified postprocedural states: Z98.890

## 2022-03-10 ENCOUNTER — Ambulatory Visit: Payer: Self-pay

## 2022-03-10 ENCOUNTER — Ambulatory Visit: Payer: 59

## 2022-03-10 ENCOUNTER — Ambulatory Visit
Admission: RE | Admit: 2022-03-10 | Discharge: 2022-03-10 | Disposition: A | Discharge status: Home / Self Care | Payer: 59 | Source: Ambulatory Visit

## 2022-03-10 ENCOUNTER — Ambulatory Visit (INDEPENDENT_AMBULATORY_CARE_PROVIDER_SITE_OTHER): Payer: 59

## 2022-03-10 ENCOUNTER — Other Ambulatory Visit: Payer: Self-pay

## 2022-03-10 VITALS — BP 158/90 | HR 83 | Temp 98.9°F | Resp 17

## 2022-03-10 DIAGNOSIS — J189 Pneumonia, unspecified organism: Secondary | ICD-10-CM

## 2022-03-10 DIAGNOSIS — J3081 Allergic rhinitis due to animal (cat) (dog) hair and dander: Secondary | ICD-10-CM

## 2022-03-10 MED ORDER — AZITHROMYCIN 250 MG PO TABS: ORAL_TABLET | ORAL | 0 refills | Status: AC

## 2022-03-10 MED ORDER — AMOXICILLIN-POT CLAVULANATE 875-125 MG PO TABS: 1.0000 | ORAL_TABLET | Freq: Two times a day (BID) | ORAL | 0 refills | Status: AC

## 2022-03-10 MED ORDER — IPRATROPIUM BROMIDE 0.06 % NA SOLN: 2.0000 | Freq: Three times a day (TID) | NASAL | 1 refills | Status: DC

## 2022-03-10 MED ORDER — FLUTICASONE PROPIONATE 50 MCG/ACT NA SUSP: 1.0000 | Freq: Every day | NASAL | 1 refills | Status: AC

## 2022-03-10 MED ORDER — ALBUTEROL SULFATE HFA 108 (90 BASE) MCG/ACT IN AERS: 2.0000 | INHALATION_SPRAY | Freq: Four times a day (QID) | RESPIRATORY_TRACT | 0 refills | Status: DC | PRN

## 2022-03-10 MED ORDER — GUAIFENESIN 400 MG PO TABS: ORAL_TABLET | ORAL | 0 refills | Status: DC

## 2022-03-10 MED ORDER — CETIRIZINE HCL 10 MG PO TABS: 10.0000 mg | ORAL_TABLET | Freq: Every day | ORAL | 1 refills | Status: AC

## 2022-03-10 NOTE — Discharge Instructions (Signed)
Per my personal read of your chest x-ray, you appear to have pneumonia in your right middle lobe at this time, my physical exam's findings support this.  I also believe that your allergies are not as well-controlled as they usually are perhaps due to your pet shedding more than usual or a new environmental allergen that is causing inflammation.    Please read below to learn more about the medications, dosages and frequencies that I recommend to help alleviate your symptoms and to get you feeling better soon:   Augmentin (amoxicillin - clavulanic acid): This is an antibiotic.  Please take 1 tablet twice daily for 5 days, you can take it with or without food.  This antibiotic can cause upset stomach, this will resolve once antibiotics are complete.  You are welcome to use a probiotic, eat yogurt, take Imodium while taking this medication.  Please avoid other systemic medications such as Maalox, Pepto-Bismol or milk of magnesia as they can interfere with your body's ability to absorb the antibiotics.  Z-Pak (azithromycin): This is an antibiotic.  Please take 2 tablets the first day, then take 1 tablet daily every day thereafter until complete.       Zyrtec (cetirizine): This is an excellent second-generation antihistamine that helps to reduce respiratory inflammatory response to environmental allergens.  In some patients, this medication can cause daytime sleepiness so I recommend that you take 1 tablet daily at bedtime.  I have provided you with a refill.   Flonase (fluticasone): This is a steroid nasal spray that you use once daily, 1 spray in each nare.  This medication does not work well if you decide to use it only used as you feel you need to, it works best used on a daily basis.  After 3 to 5 days of use, you will notice significant reduction of the inflammation and mucus production that is currently being caused by exposure to allergens, whether seasonal or environmental.  The most common side effect  of this medication is nosebleeds.  If you experience a nosebleed, please discontinue use for 1 week, then feel free to resume.  I have provided you with a refill.     Atrovent (ipratropium): This is an excellent nasal decongestant spray I have added to your recommended nasal steroid that will not cause rebound congestion, please instill 2 sprays into each nare with each use.  Because nasal steroids can take several days before they begin to provide full benefit, I recommend that you use this spray in addition to the nasal steroid prescribed for you.  Please use it after you have used your nasal steroid and repeat up to 3 times daily as needed.         ProAir, Ventolin, Proventil (albuterol): This inhaled medication contains a short acting beta agonist bronchodilator.  This medication works on the smooth muscle that opens and constricts of your airways by relaxing the muscle.  The result of relaxation of the smooth muscle is increased air movement and improved work of breathing.  This is a short acting medication that can be used every 4-6 hours as needed for increased work of breathing, shortness of breath, wheezing and excessive coughing.      Advil, Motrin (ibuprofen): This is a good anti-inflammatory medication which not only addresses aches, pains but also significantly reduces soft tissue inflammation of the upper airways that causes sinus and nasal congestion as well as inflammation of the lower airways which makes you feel like your breathing is constricted  or your cough feel tight.  I recommend that you take 400 mg every 8 hours as needed.      Robitussin, Mucinex (guaifenesin): This is an expectorant.  This helps break up chest congestion and loosen up thick nasal drainage making phlegm and drainage more liquid and therefore easier to remove.  I recommend being 400 mg three times daily as needed.      Please follow-up within the next 5-7 days either with your primary care provider or urgent care  if your symptoms do not resolve, please follow-up sooner if your symptoms are worsening despite treatment.  If you do not have a primary care provider, we will assist you in finding one.        Thank you for visiting urgent care today.  We appreciate the opportunity to participate in your care.

## 2022-03-10 NOTE — ED Triage Notes (Signed)
Pt c/o worsening cough x2 weeks with nasal congestion.   Home interventions: OTC cold and cough medications

## 2022-03-10 NOTE — ED Provider Notes (Signed)
[facility]    CSN: [id number] Arrival date & time: 03/10/22  [id number]    HISTORY   Chief Complaint  Patient presents with   Cough    Entered by patient   HPI Carl Zhang is a pleasant, 51 y.o. male who presents to urgent care today. Pt c/o worsening cough x2 weeks with nasal congestion.  Patient states the cough became productive last night, states his wife scheduled the appointment for him because he coughed so much during their Christmas festivities yesterday.  Patient is afebrile on arrival today but his oxygen saturation is at 94% and his blood pressure is elevated.  Patient denies history of hypertension.   Home interventions: OTC cold and cough medications.  Patient states he takes Zyrtec daily because to pet dander, currently has 2 cats and a dog.  Patient states Zyrtec typically keeps his allergies well-controlled.   Cough  Past Medical History:  Diagnosis Date   Hx of colonoscopy 03/06/2022   Seasonal allergies    There are no problems to display for this patient.  Past Surgical History:  Procedure Laterality Date   HEMORRHOID SURGERY     NERVE REPAIR Left 05/21/2015   Procedure: LEFT SMALL FINGER DIGITAL NERVE REPAIR;  Surgeon: Mack Hookavid Thompson, MD;  Location: [facility];  Service: Orthopedics;  Laterality: Left;    Home Medications    Prior to Admission medications   Medication Sig Start Date End Date Taking? Authorizing Provider  pantoprazole (PROTONIX) 40 MG tablet Take 40 mg by mouth daily.   Yes [provider]  cetirizine (ZYRTEC) 10 MG tablet Take 10 mg by mouth daily.    [provider]  fluticasone (FLONASE) 50 MCG/ACT nasal spray Place into both nostrils daily.    [provider]  polyethylene glycol-electrolytes (NULYTELY) 420 g solution Take by mouth. 02/16/20   [provider]    Family History History reviewed. No pertinent family history. Social History Social History   Tobacco Use    Smoking status: Never   Smokeless tobacco: Never  Substance Use Topics   Alcohol use: No   Drug use: No   Allergies   Patient has no known allergies.  Review of Systems Review of Systems  Respiratory:  Positive for cough.    Pertinent findings revealed after performing a 14 point review of systems has been noted in the history of present illness.  Physical Exam Triage Vital Signs ED Triage Vitals  Enc Vitals Group     BP 01/10/21 0827 (!) 147/82     Pulse Rate 01/10/21 0827 72     Resp 01/10/21 0827 18     Temp 01/10/21 0827 98.3 F (36.8 C)     Temp Source 01/10/21 0827 Oral     SpO2 01/10/21 0827 98 %     Weight --      Height --      Head Circumference --      Peak Flow --      Pain Score 01/10/21 0826 5     Pain Loc --      Pain Edu? --      Excl. in GC? --   No data found.  Updated Vital Signs BP (!) 158/90 (BP Location: Right Arm)   Pulse 83   Temp 98.9 F (37.2 C) (Oral)   Resp 17   SpO2 94%   Physical Exam Vitals and nursing note reviewed.  Constitutional:      General: He is not  in acute distress.    Appearance: Normal appearance. He is not ill-appearing.  HENT:     Head: Normocephalic and atraumatic.     Salivary Glands: Right salivary gland is not diffusely enlarged or tender. Left salivary gland is not diffusely enlarged or tender.     Right Ear: Ear canal and external ear normal. No drainage. A middle ear effusion is present. There is no impacted cerumen. Tympanic membrane is bulging. Tympanic membrane is not injected or erythematous.     Left Ear: Ear canal and external ear normal. No drainage. A middle ear effusion is present. There is no impacted cerumen. Tympanic membrane is bulging. Tympanic membrane is not injected or erythematous.     Ears:     Comments: Bilateral EACs normal, both TMs bulging with clear fluid    Nose: Rhinorrhea present. No nasal deformity, septal deviation, signs of injury, nasal tenderness, mucosal edema or congestion.  Rhinorrhea is clear.     Right Nostril: Occlusion present. No foreign body, epistaxis or septal hematoma.     Left Nostril: Occlusion present. No foreign body, epistaxis or septal hematoma.     Right Turbinates: Enlarged, swollen and pale.     Left Turbinates: Enlarged, swollen and pale.     Right Sinus: No maxillary sinus tenderness or frontal sinus tenderness.     Left Sinus: No maxillary sinus tenderness or frontal sinus tenderness.     Mouth/Throat:     Lips: Pink. No lesions.     Mouth: Mucous membranes are moist. No oral lesions.     Pharynx: Oropharynx is clear. Uvula midline. No posterior oropharyngeal erythema or uvula swelling.     Tonsils: No tonsillar exudate. 0 on the right. 0 on the left.     Comments: Postnasal drip Eyes:     General: Lids are normal.        Right eye: No discharge.        Left eye: No discharge.     Extraocular Movements: Extraocular movements intact.     Conjunctiva/sclera: Conjunctivae normal.     Right eye: Right conjunctiva is not injected.     Left eye: Left conjunctiva is not injected.  Neck:     Trachea: Trachea and phonation normal.  Cardiovascular:     Rate and Rhythm: Normal rate and regular rhythm.     Pulses: Normal pulses.     Heart sounds: Normal heart sounds. No murmur heard.    No friction rub. No gallop.  Pulmonary:     Effort: Pulmonary effort is normal. No accessory muscle usage, prolonged expiration or respiratory distress.     Breath sounds: No stridor, decreased air movement or transmitted upper airway sounds. Examination of the right-middle field reveals decreased breath sounds and rales. Examination of the right-lower field reveals decreased breath sounds. Decreased breath sounds and rales present. No wheezing or rhonchi.  Chest:     Chest wall: No tenderness.  Musculoskeletal:        General: Normal range of motion.     Cervical back: Normal range of motion and neck supple. Normal range of motion.  Lymphadenopathy:      Cervical: No cervical adenopathy.  Skin:    General: Skin is warm and dry.     Findings: No erythema or rash.  Neurological:     General: No focal deficit present.     Mental Status: He is alert and oriented to person, place, and time.  Psychiatric:  Mood and Affect: Mood normal.        Behavior: Behavior normal.     Visual Acuity Right Eye Distance:   Left Eye Distance:   Bilateral Distance:    Right Eye Near:   Left Eye Near:    Bilateral Near:     [facility] Couse / Diagnostics / Procedures:     Radiology DG Chest 2 View  Result Date: 03/10/2022 CLINICAL DATA:  Decreased breath sounds on the right. Persistent cough. EXAM: CHEST - 2 VIEW COMPARISON:  None Available. FINDINGS: The heart size and mediastinal contours are within normal limits. Both lungs are clear. The visualized skeletal structures are unremarkable. IMPRESSION: No active cardiopulmonary disease. Electronically Signed   By: Gerome Sam III M.D.   On: 03/10/2022 11:10    Procedures Procedures (including critical care time) EKG  Pending results:  Labs Reviewed - No data to display  Medications Ordered in UC: Medications - No data to display  UC Diagnoses / Final Clinical Impressions(s)   I have reviewed the triage vital signs and the nursing notes.  Pertinent labs & imaging results that were available during my care of the patient were reviewed by me and considered in my medical decision making (see chart for details).    Final diagnoses:  Pneumonia of right middle lobe due to infectious organism  Allergic rhinitis due to animal hair and dander   Patient advised of x-ray findings.  Patient advised to began Augmentin and azithromycin for empiric treatment of presumed bacterial pneumonia.  Conservative care recommended.  Supportive medications advised. Please see discharge instructions below for further details of plan of care as provided to patient. ED Prescriptions     Medication Sig Dispense  Auth. Provider   amoxicillin-clavulanate (AUGMENTIN) 875-125 MG tablet Take 1 tablet by mouth 2 (two) times daily for 5 days. 10 tablet Theadora Rama Scales, PA-C   azithromycin (ZITHROMAX) 250 MG tablet Take 2 tablets (500 mg total) by mouth daily for 1 day, THEN 1 tablet (250 mg total) daily for 4 days. 6 tablet Theadora Rama Scales, PA-C   cetirizine (ZYRTEC ALLERGY) 10 MG tablet Take 1 tablet (10 mg total) by mouth at bedtime. 90 tablet Theadora Rama Scales, PA-C   fluticasone (FLONASE) 50 MCG/ACT nasal spray Place 1 spray into both nostrils daily. 47.4 mL Theadora Rama Scales, PA-C   ipratropium (ATROVENT) 0.06 % nasal spray Place 2 sprays into both nostrils 3 (three) times daily. As needed for nasal congestion, runny nose 15 mL Theadora Rama Scales, PA-C   guaifenesin (HUMIBID E) 400 MG TABS tablet Take 1 tablet 3 times daily as needed for chest congestion and cough 21 tablet Theadora Rama Scales, PA-C   albuterol (VENTOLIN HFA) 108 (90 Base) MCG/ACT inhaler Inhale 2 puffs into the lungs every 6 (six) hours as needed for wheezing or shortness of breath (Cough). 18 g Theadora Rama Scales, PA-C      PDMP not reviewed this encounter.  Disposition Upon Discharge:  Condition: stable for discharge home Home: take medications as prescribed; routine discharge instructions as discussed; follow up as advised.  Patient presented with an acute illness with associated systemic symptoms and significant discomfort requiring urgent management. In my opinion, this is a condition that a prudent lay person (someone who possesses an average knowledge of health and medicine) may potentially expect to result in complications if not addressed urgently such as respiratory distress, impairment of bodily function or dysfunction of bodily organs.   Routine symptom specific, illness specific  and/or disease specific instructions were discussed with the patient and/or caregiver at length.   As such, the  patient has been evaluated and assessed, work-up was performed and treatment was provided in alignment with urgent care protocols and evidence based medicine.  Patient/parent/caregiver has been advised that the patient may require follow up for further testing and treatment if the symptoms continue in spite of treatment, as clinically indicated and appropriate.  If the patient was tested for COVID-19, Influenza and/or RSV, then the patient/parent/guardian was advised to isolate at home pending the results of his/her diagnostic coronavirus test and potentially longer if they're positive. I have also advised pt that if his/her COVID-19 test returns positive, it's recommended to self-isolate for at least 10 days after symptoms first appeared AND until fever-free for 24 hours without fever reducer AND other symptoms have improved or resolved. Discussed self-isolation recommendations as well as instructions for household member/close contacts as per the [facility] and [place] DHHS, and also gave patient the COVID packet with this information.  Patient/parent/caregiver has been advised to return to the [facility] or PCP in 3-5 days if no better; to PCP or the Emergency Department if new signs and symptoms develop, or if the current signs or symptoms continue to change or worsen for further workup, evaluation and treatment as clinically indicated and appropriate  The patient will follow up with their current PCP if and as advised. If the patient does not currently have a PCP we will assist them in obtaining one.   The patient may need specialty follow up if the symptoms continue, in spite of conservative treatment and management, for further workup, evaluation, consultation and treatment as clinically indicated and appropriate.  Patient/parent/caregiver verbalized understanding and agreement of plan as discussed.  All questions were addressed during visit.  Please see discharge instructions below for further details of  plan.  Discharge Instructions:   Discharge Instructions      Per my personal read of your chest x-ray, you appear to have pneumonia in your right middle lobe at this time, my physical exam's findings support this.  I also believe that your allergies are not as well-controlled as they usually are perhaps due to your pet shedding more than usual or a new environmental allergen that is causing inflammation.    Please read below to learn more about the medications, dosages and frequencies that I recommend to help alleviate your symptoms and to get you feeling better soon:   Augmentin (amoxicillin - clavulanic acid): This is an antibiotic.  Please take 1 tablet twice daily for 5 days, you can take it with or without food.  This antibiotic can cause upset stomach, this will resolve once antibiotics are complete.  You are welcome to use a probiotic, eat yogurt, take Imodium while taking this medication.  Please avoid other systemic medications such as Maalox, Pepto-Bismol or milk of magnesia as they can interfere with your body's ability to absorb the antibiotics.  Z-Pak (azithromycin): This is an antibiotic.  Please take 2 tablets the first day, then take 1 tablet daily every day thereafter until complete.       Zyrtec (cetirizine): This is an excellent second-generation antihistamine that helps to reduce respiratory inflammatory response to environmental allergens.  In some patients, this medication can cause daytime sleepiness so I recommend that you take 1 tablet daily at bedtime.  I have provided you with a refill.   Flonase (fluticasone): This is a steroid nasal spray that you use once daily, 1  spray in each nare.  This medication does not work well if you decide to use it only used as you feel you need to, it works best used on a daily basis.  After 3 to 5 days of use, you will notice significant reduction of the inflammation and mucus production that is currently being caused by exposure to  allergens, whether seasonal or environmental.  The most common side effect of this medication is nosebleeds.  If you experience a nosebleed, please discontinue use for 1 week, then feel free to resume.  I have provided you with a refill.     Atrovent (ipratropium): This is an excellent nasal decongestant spray I have added to your recommended nasal steroid that will not cause rebound congestion, please instill 2 sprays into each nare with each use.  Because nasal steroids can take several days before they begin to provide full benefit, I recommend that you use this spray in addition to the nasal steroid prescribed for you.  Please use it after you have used your nasal steroid and repeat up to 3 times daily as needed.         ProAir, Ventolin, Proventil (albuterol): This inhaled medication contains a short acting beta agonist bronchodilator.  This medication works on the smooth muscle that opens and constricts of your airways by relaxing the muscle.  The result of relaxation of the smooth muscle is increased air movement and improved work of breathing.  This is a short acting medication that can be used every 4-6 hours as needed for increased work of breathing, shortness of breath, wheezing and excessive coughing.      Advil, Motrin (ibuprofen): This is a good anti-inflammatory medication which not only addresses aches, pains but also significantly reduces soft tissue inflammation of the upper airways that causes sinus and nasal congestion as well as inflammation of the lower airways which makes you feel like your breathing is constricted or your cough feel tight.  I recommend that you take 400 mg every 8 hours as needed.      Robitussin, Mucinex (guaifenesin): This is an expectorant.  This helps break up chest congestion and loosen up thick nasal drainage making phlegm and drainage more liquid and therefore easier to remove.  I recommend being 400 mg three times daily as needed.      Please follow-up  within the next 5-7 days either with your primary care provider or urgent care if your symptoms do not resolve, please follow-up sooner if your symptoms are worsening despite treatment.  If you do not have a primary care provider, we will assist you in finding one.        Thank you for visiting urgent care today.  We appreciate the opportunity to participate in your care.       This office note has been dictated using Teaching laboratory technician.  Unfortunately, this method of dictation can sometimes lead to typographical or grammatical errors.  I apologize for your inconvenience in advance if this occurs.  Please do not hesitate to reach out to me if clarification is needed.      Theadora Rama Scales, PA-C 03/12/22 1346

## 2022-03-21 ENCOUNTER — Ambulatory Visit (INDEPENDENT_AMBULATORY_CARE_PROVIDER_SITE_OTHER): Payer: 59

## 2022-03-21 ENCOUNTER — Ambulatory Visit
Admission: RE | Admit: 2022-03-21 | Discharge: 2022-03-21 | Disposition: A | Discharge status: Home / Self Care | Payer: 59 | Source: Ambulatory Visit | Attending: Urgent Care | Admitting: Urgent Care

## 2022-03-21 VITALS — BP 150/89 | HR 92 | Temp 99.0°F | Resp 20

## 2022-03-21 DIAGNOSIS — R053 Chronic cough: Secondary | ICD-10-CM

## 2022-03-21 DIAGNOSIS — R059 Cough, unspecified: Secondary | ICD-10-CM

## 2022-03-21 DIAGNOSIS — J209 Acute bronchitis, unspecified: Secondary | ICD-10-CM

## 2022-03-21 HISTORY — DX: Gastro-esophageal reflux disease without esophagitis: K21.9

## 2022-03-21 MED ORDER — PROMETHAZINE-DM 6.25-15 MG/5ML PO SYRP: 5.0000 mL | ORAL_SOLUTION | Freq: Three times a day (TID) | ORAL | 0 refills | Status: DC | PRN

## 2022-03-21 MED ORDER — PREDNISONE 20 MG PO TABS: ORAL_TABLET | ORAL | 0 refills | Status: DC

## 2022-03-21 NOTE — ED Triage Notes (Signed)
Pt c/o dry cough x 4 weeks-states he was dx with PNA/completed abx/meds-states cough cont'd-NAD-steady gait

## 2022-03-21 NOTE — ED Provider Notes (Signed)
[facility]  Note:  This document was prepared using Conservation officer, historic buildings and may include unintentional dictation errors.  MRN: [id number] DOB: 01/29/1971  Subjective:   Carl Zhang is a 52 y.o. male presenting for 5-6 week history of persistent coughing. Was seen and had imaging done. Chest x-ray was negative. Was still treated with Augmentin and azithromycin. No change to his symptoms. No shob, wheezing. No history of asthma.   No current facility-administered medications for this encounter.  Current Outpatient Medications:    albuterol (VENTOLIN HFA) 108 (90 Base) MCG/ACT inhaler, Inhale 2 puffs into the lungs every 6 (six) hours as needed for wheezing or shortness of breath (Cough)., Disp: 18 g, Rfl: 0   cetirizine (ZYRTEC ALLERGY) 10 MG tablet, Take 1 tablet (10 mg total) by mouth at bedtime., Disp: 90 tablet, Rfl: 1   fluticasone (FLONASE) 50 MCG/ACT nasal spray, Place 1 spray into both nostrils daily., Disp: 47.4 mL, Rfl: 1   guaifenesin (HUMIBID E) 400 MG TABS tablet, Take 1 tablet 3 times daily as needed for chest congestion and cough, Disp: 21 tablet, Rfl: 0   ipratropium (ATROVENT) 0.06 % nasal spray, Place 2 sprays into both nostrils 3 (three) times daily. As needed for nasal congestion, runny nose, Disp: 15 mL, Rfl: 1   pantoprazole (PROTONIX) 40 MG tablet, Take 40 mg by mouth daily., Disp: , Rfl:    polyethylene glycol-electrolytes (NULYTELY) 420 g solution, Take by mouth., Disp: , Rfl:    No Known Allergies  Past Medical History:  Diagnosis Date   GERD (gastroesophageal reflux disease)    Hx of colonoscopy 03/06/2022   Seasonal allergies      Past Surgical History:  Procedure Laterality Date   HEMORRHOID SURGERY     NERVE REPAIR Left 05/21/2015   Procedure: LEFT SMALL FINGER DIGITAL NERVE REPAIR;  Surgeon: Mack Hook, MD;  Location: [facility];  Service: Orthopedics;  Laterality: Left;    No family history  on file.  Social History   Tobacco Use   Smoking status: Never   Smokeless tobacco: Never  Vaping Use   Vaping Use: Never used  Substance Use Topics   Alcohol use: No   Drug use: No    ROS   Objective:   Vitals: BP (!) 150/89 (BP Location: Right Arm)   Pulse 92   Temp 99 F (37.2 C) (Oral)   Resp 20   SpO2 95%   Physical Exam Constitutional:      General: He is not in acute distress.    Appearance: Normal appearance. He is well-developed. He is not ill-appearing, toxic-appearing or diaphoretic.  HENT:     Head: Normocephalic and atraumatic.     Right Ear: External ear normal.     Left Ear: External ear normal.     Nose: Congestion present. No rhinorrhea.     Mouth/Throat:     Mouth: Mucous membranes are moist.     Pharynx: No oropharyngeal exudate or posterior oropharyngeal erythema.  Eyes:     General: No scleral icterus.       Right eye: No discharge.        Left eye: No discharge.     Extraocular Movements: Extraocular movements intact.  Cardiovascular:     Rate and Rhythm: Normal rate and regular rhythm.     Heart sounds: Normal heart sounds. No murmur heard.    No friction rub. No gallop.  Pulmonary:     Effort:  Pulmonary effort is normal. No respiratory distress.     Breath sounds: No stridor. Rhonchi present. No wheezing or rales.  Neurological:     Mental Status: He is alert and oriented to person, place, and time.  Psychiatric:        Mood and Affect: Mood normal.        Behavior: Behavior normal.        Thought Content: Thought content normal.     Assessment and Plan :   PDMP not reviewed this encounter.  1. Acute bronchitis, unspecified organism     X-ray over-read was pending at time of discharge, recommended follow up with only abnormal results. Otherwise will not call for negative over-read. Patient was in agreement.   Recommended an oral prednisone course given his persistent coughing, respiratory symptoms and physical exam.   Counseled patient on potential for adverse effects with medications prescribed/recommended today, ER and return-to-clinic precautions discussed, patient verbalized understanding.    Jaynee Eagles, [place] 03/21/22 1135

## 2022-07-27 ENCOUNTER — Ambulatory Visit (INDEPENDENT_AMBULATORY_CARE_PROVIDER_SITE_OTHER): Payer: 59 | Admitting: Neurology

## 2022-07-27 ENCOUNTER — Telehealth: Payer: Self-pay | Admitting: Neurology

## 2022-07-27 ENCOUNTER — Encounter: Payer: Self-pay | Admitting: Neurology

## 2022-07-27 VITALS — BP 127/87 | HR 72 | Ht 76.0 in | Wt 224.0 lb

## 2022-07-27 DIAGNOSIS — G20C Parkinsonism, unspecified: Secondary | ICD-10-CM

## 2022-07-27 NOTE — Telephone Encounter (Signed)
[employer] no auth required Case Number: [id number] sent to [facility] [id number]

## 2022-07-27 NOTE — Progress Notes (Signed)
Subjective:    Patient ID: Carl Zhang is a 52 y.o. male.  HPI    Carl Foley, MD, PhD [facility] [address], [address] [address] [place], [place] [address]  Dear Dr. Rubye Oaks,  I saw your patient, Carl Zhang, upon your kind request in my neurologic clinic today for initial consultation of his tremor.  The patient is unaccompanied today.  As you know, Carl Zhang is a 52 year old male with an underlying medical history of allergies, reflux disease, hyperlipidemia, and overweight state, who reports intermittent trembling and stiffness as well as aching in his right upper extremity since January 2024.  His pain is not a neck pain and does not radiate from the shoulder or neck down to the arm, it is a forearm achiness, sometimes stiffness and jitteriness.  He feels that he may suppress the tremor sometimes when not painful attention, he has had trouble navigating his computer mouse and has had difficulty with fine motor skills, has found himself doing more things with the nondominant left hand lately.  His handwriting is changed, it has become smaller and more sloppy.  He has not fallen, no balance issues, denies any lightheadedness but does report being a little bit more careful when showering as far as his balance.  He denies any vertiginous symptoms or recurrent headaches, denies any sudden onset of one-sided weakness or numbness or tingling or droopy face or slurring of speech, he denies family history of tremor or Parkinson's disease.  His dad lived to be 75 and had a history of traumatic brain injury, epilepsy and stroke.  He was in a nursing home for the last 10 years of his life.  His mom lived to be 72 and had peritoneal cancer.  He has 2 younger sisters, neither 1 with any tremor issues.  He lives with his wife and 2 young children, ages 2 and 20.  He works full-time as a Occupational hygienist.  He is a non-smoker and limits his caffeine to 1 cup of coffee typically, no  daily tea or soda.  He tries to hydrate well with water.  He does not drink any alcohol.  He denies any prodromal illness, had a colonoscopy recently.  He had a bout of bronchitis and concern for pneumonia at the end of last year and beginning of this year.  He was treated with antibiotics and prednisone as well as an inhaler which he no longer uses.   I reviewed your office note from 06/09/2022.  He reported right hand pain and tremors since January 2024 at the time.  He has not had any recent blood work.    His Past Medical History Is Significant For: Past Medical History:  Diagnosis Date   GERD (gastroesophageal reflux disease)    Hx of colonoscopy 03/06/2022   Seasonal allergies     His Past Surgical History Is Significant For: Past Surgical History:  Procedure Laterality Date   HEMORRHOID SURGERY     NERVE REPAIR Left 05/21/2015   Procedure: LEFT SMALL FINGER DIGITAL NERVE REPAIR;  Surgeon: Mack Hook, MD;  Location: [facility];  Service: Orthopedics;  Laterality: Left;    His Family History Is Significant For: Family History  Problem Relation Age of Onset   Parkinson's disease Neg Hx    Tremor Neg Hx     His Social History Is Significant For: Social History   Socioeconomic History   Marital status: Single    Spouse name: Not on file  Number of children: Not on file   Years of education: Not on file   Highest education level: Not on file  Occupational History   Not on file  Tobacco Use   Smoking status: Never   Smokeless tobacco: Never  Vaping Use   Vaping Use: Never used  Substance and Sexual Activity   Alcohol use: No   Drug use: No   Sexual activity: Not on file  Other Topics Concern   Not on file  Social History Narrative   Not on file   Social Determinants of Health   Financial Resource Strain: Not on file  Food Insecurity: Not on file  Transportation Needs: Not on file  Physical Activity: Not on file  Stress: Not on file  Social  Connections: Not on file    His Allergies Are:  No Known Allergies:   His Current Medications Are:  Outpatient Encounter Medications as of 07/27/2022  Medication Sig   cetirizine (ZYRTEC ALLERGY) 10 MG tablet Take 1 tablet (10 mg total) by mouth at bedtime.   fluticasone (FLONASE) 50 MCG/ACT nasal spray Place 1 spray into both nostrils daily.   pantoprazole (PROTONIX) 40 MG tablet Take 40 mg by mouth daily.   albuterol (VENTOLIN HFA) 108 (90 Base) MCG/ACT inhaler Inhale 2 puffs into the lungs every 6 (six) hours as needed for wheezing or shortness of breath (Cough).   guaifenesin (HUMIBID E) 400 MG TABS tablet Take 1 tablet 3 times daily as needed for chest congestion and cough   ipratropium (ATROVENT) 0.06 % nasal spray Place 2 sprays into both nostrils 3 (three) times daily. As needed for nasal congestion, runny nose   polyethylene glycol-electrolytes (NULYTELY) 420 g solution Take by mouth.   predniSONE (DELTASONE) 20 MG tablet Take 2 tablets daily with breakfast.   promethazine-dextromethorphan (PROMETHAZINE-DM) 6.25-15 MG/5ML syrup Take 5 mLs by mouth 3 (three) times daily as needed for cough.   No facility-administered encounter medications on file as of 07/27/2022.  :   Review of Systems:  Out of a complete 14 point review of systems, all are reviewed and negative with the exception of these symptoms as listed below:   Review of Systems  Neurological:        Pt here for tremors Pt states tremors in right arm and hand        Objective:  Neurological Exam  Physical Exam Physical Examination:   Vitals:   07/27/22 0811  BP: 127/87  Pulse: 72    General Examination: The patient is a very pleasant 52 y.o. male in no acute distress. He appears well-developed and well-nourished and well groomed.   HEENT: Normocephalic, atraumatic, pupils are equal, round and reactive to light, extraocular tracking is well-preserved, no obvious saccadic breakdown, no nystagmus noted.   Hearing is grossly intact.  Face is symmetric with slight facial masking noted, neck with mild nuchal rigidity, full range of passive motion, no carotid bruits.  Airway examination reveals no significant mouth dryness.  Tongue protrudes centrally and palate elevates symmetrically.  No lip, neck or jaw tremor.  No significant hypophonia, no dysarthria.    Chest: Clear to auscultation without wheezing, rhonchi or crackles noted.  Heart: S1+S2+0, regular and normal without murmurs, rubs or gallops noted.   Abdomen: Soft, non-tender and non-distended.  Extremities: There is no pitting edema in the distal lower extremities bilaterally.   Skin: Warm and dry without trophic changes noted.   Musculoskeletal: exam reveals no obvious joint deformities.   Neurologically:  Mental status: The patient is awake, alert and oriented in all 4 spheres. His immediate and remote memory, attention, language skills and fund of knowledge are appropriate. There is no evidence of aphasia, agnosia, apraxia or anomia. Speech is clear with normal prosody and enunciation. Thought process is linear. Mood is normal and affect is normal.  Cranial nerves II - XII are as described above under HEENT exam.  Motor exam: Normal bulk, strength noted in all 4 extremities, tone slightly increased in the right upper extremity especially in the wrist.  He has a very slight resting tremor in the right upper extremity which is intermittent, no other resting tremor noted.  He has a very slight postural tremor in the right upper extremity only.  He has no significant action tremor, no intention tremor, no lower extremity tremor.   On 07/27/2022: On Archimedes spiral drawing he has no significant trembling, handwriting with the right hand is legible, he can write cursive, not particularly micrographic, not particularly tremulous. Fine motor skills and coordination: Mild impairment of finger taps and hand movements as well as rapid alternating  patting and foot taps on the right side, normal on the left side in the upper and lower extremities.  .  Cerebellar testing: No dysmetria or intention tremor. There is no truncal or gait ataxia.  No difficulty with finger-to-nose, no difficulty with heel-to-shin. No drift or rebound, Romberg negative, tandem walk doable. Sensory exam: intact to light touch in the upper and lower extremities.  Gait, station and balance: He stands without difficulty, posture slightly stooped for age, he walks without any shuffling, mild decrease in arm swing on the right noted, otherwise no freezing, no start hesitation, good stride length and pace overall.  Assessment and Plan:  In summary, HOBART SIEG is a very pleasant 52 y.o.-year old male with an underlying medical history of allergies, reflux disease, hyperlipidemia, and overweight state, who presents for evaluation of his tremor disorder of approximately 4 months duration, noticed since January 2024.  History and examination findings are in keeping with parkinsonism, with right-sided predominance noted and lateralization on exam to the right with intermittent resting tremor also noted on the right side only.  Findings on the mild side.  I talked with the patient at length today, this was a longer appointment of over 60 minutes with extensive counseling and coordination of care, discussion, and test discussion as well as extended chart review involved.  I suggested we proceed with a [name] which is a nuclear medicine brain scan that helps [place] with tremor conditions as a supportive test.  He is advised that this is not a definitive test for Parkinson's disease but is a helpful tool.  We talked about potentially utilizing symptomatic medication, we talked about the importance of maintaining a healthy lifestyle and positive outlook.  I explained his diagnosis, prognosis and symptomatic treatment options to him.  We will proceed with a [name], he is agreeable, I  explained this test at length to him as well.  He was given instructions in my chart in detail as well.  He is advised to continue with good hydration, good nutrition, maintaining a healthy lifestyle and positive outlook on things.  He is encouraged to incorporate regular formal exercise if possible.  He is advised that we will call him to schedule his [name] and also with the results and plan to follow-up afterwards.  I answered all his questions today and he was in agreement with our plan.  Thank you  very much for allowing me to participate in the care of this nice patient. If I can be of any further assistance to you please do not hesitate to call me at [phone].  Sincerely,   Star Age, MD, PhD

## 2022-07-27 NOTE — Patient Instructions (Signed)
It was nice to meet you today! I believe you have mild Parkinson's-like changes, affecting your right side and findings are on the mild side. Nevertheless, as you know, this disease does progress with time. It can affect your balance, your memory, your mood, your bowel and bladder function, your posture, balance and walking and your activities of daily living. However, there are good supportive treatments and symptomatic treatments available, so most patients have a change to a good quality life and life expectancy is not typically altered. Overall you are doing fairly well but I do want to suggest a few things today:  Remember to drink plenty of fluid at least 6 glasses (8 oz each), eat healthy meals and do not skip any meals. Try to eat protein with a every meal and eat a healthy snack such as fruit or nuts in between meals. Try to keep a regular sleep-wake schedule and try to exercise daily, particularly in the form of walking, 20-30 minutes a day, if you can.   We may consider starting you on symptomatic medication.   Please remember, no medication is without potential side effects and not every medication is right for every patient with PD.   Try to stay active physically and mentally. Engage in social activities in your community and with your family and try to keep up with current events by reading the newspaper or watching the news. Try to do word puzzles and you may like to do puzzles and brain games on the computer such as on [email or web address]   We may consider a brain MRI in the near future.   I would like to go ahead with a so-called [name]: This is a specialized brain scan designed to help with diagnosis of tremor disorders. A radioactive marker gets injected and the uptake is measured in the brain and compared to normal controls and right side is compared to the left, a change in uptake can help with diagnosis of certain tremor disorders. A brain MRI on the other hand is a brain scan that  helps look at the brain structure in more detail overall and look for age-related changes, blood vessel related changes and look for stroke and volume loss which we call atrophy.  I would like for you to see back after the scan.    For any emergencies you know to call 911 or go to the nearest emergency room.

## 2022-08-26 ENCOUNTER — Telehealth: Payer: Self-pay | Admitting: *Deleted

## 2022-08-26 ENCOUNTER — Encounter (HOSPITAL_COMMUNITY)
Admission: RE | Admit: 2022-08-26 | Discharge: 2022-08-26 | Disposition: A | Discharge status: Home / Self Care | Payer: 59 | Source: Ambulatory Visit | Attending: Neurology | Admitting: Neurology

## 2022-08-26 DIAGNOSIS — G20C Parkinsonism, unspecified: Secondary | ICD-10-CM | POA: Insufficient documentation

## 2022-08-26 MED ORDER — POTASSIUM IODIDE (ANTIDOTE) 130 MG PO TABS
130.0000 mg | ORAL_TABLET | Freq: Once | ORAL | Status: AC
  Administered 2022-08-26: 130 mg via ORAL

## 2022-08-26 MED ORDER — POTASSIUM IODIDE (ANTIDOTE) 130 MG PO TABS
ORAL_TABLET | ORAL | Status: AC
  Filled 2022-08-26: qty 1

## 2022-08-26 MED ORDER — IOFLUPANE I 123 185 MBQ/2.5ML IV SOLN
4.5000 | Freq: Once | INTRAVENOUS | Status: AC | PRN
  Administered 2022-08-26: 4.5 via INTRAVENOUS

## 2022-08-26 NOTE — Telephone Encounter (Signed)
Spoke to patient gave [name] results Made pt f/u appointment in July 2024 with Dr. Frances Furbish  Pt states read results on mychart and thanked me for calling .

## 2022-08-26 NOTE — Telephone Encounter (Signed)
-----   Message from Huston Foley, MD sent at 08/26/2022  3:16 PM EDT ----- Please call patient (or designated party on DPR) regarding the recent nuclear medicine [name] result. As discussed, this is a specialized brain scan designed to aid with the diagnosis of tremor disorders, including parkinsonian disorders. A radioactive marker gets injected and the uptake is measured in the brain and compared to normal controls and right side of the brain is compared to the left side. A change in uptake can help with diagnosis of certain tremor disorders and narrow down the diagnostic possibilities. The patient's recent scan indicated abnormal (as in lower) uptake as compared to normal uptake pattern. This supports an underlying parkinsonian disorder. Keep in mind, this is not a definitive test for Parkinson's disease and does not distinguish between Parkinson's disease and other, atypical parkinsonian disorders.  Please advise patient to schedule a follow-up appointment so we can talk about treatment options for him.

## 2022-09-21 ENCOUNTER — Encounter: Payer: Self-pay | Admitting: Neurology

## 2022-09-21 ENCOUNTER — Ambulatory Visit (INDEPENDENT_AMBULATORY_CARE_PROVIDER_SITE_OTHER): Payer: 59 | Admitting: Neurology

## 2022-09-21 VITALS — BP 135/90 | HR 75 | Ht 76.0 in | Wt 220.0 lb

## 2022-09-21 DIAGNOSIS — R0681 Apnea, not elsewhere classified: Secondary | ICD-10-CM | POA: Diagnosis not present

## 2022-09-21 DIAGNOSIS — G20C Parkinsonism, unspecified: Secondary | ICD-10-CM

## 2022-09-21 DIAGNOSIS — R0683 Snoring: Secondary | ICD-10-CM | POA: Diagnosis not present

## 2022-09-21 DIAGNOSIS — Z9189 Other specified personal risk factors, not elsewhere classified: Secondary | ICD-10-CM

## 2022-09-21 MED ORDER — ROPINIROLE HCL 0.25 MG PO TABS: 0.7500 mg | ORAL_TABLET | Freq: Three times a day (TID) | ORAL | 3 refills | Status: DC

## 2022-09-21 NOTE — Patient Instructions (Addendum)
It was nice to see you both today. As discussed, we will start you on symptomatic medication for your Parksinson's symptoms:  Requip (generic name: ropinirole) 0.25 mg: Take one pill twice daily (morning and lunchtime) for one week, then one pill 3 times a day (morning, lunch and evening) for one week, then 2 pills 3 times a day for one week, then 3 pills three times a day thereafter. Common side effects reported are: Sedation, sleepiness, nausea, vomiting, and rare side effects are confusion, hallucinations, swelling in legs, and abnormal behaviors, including impulse control problems, which can manifest as excessive eating, obsessions with food or gambling, or hypersexuality.  Stay active mentally and physically, try to make time for regular exercise.   As discussed, we will proceed with a home sleep test to look into the possibility of sleep apnea.  If you have obstructive sleep apnea, I will likely recommend that you try home AutoPap therapy which is a CPAP like machine.

## 2022-09-21 NOTE — Progress Notes (Signed)
Subjective:    Patient ID: Carl Zhang is a 52 y.o. male.  HPI    Interim history:   Carl Zhang is a 52 year old male with an underlying medical history of allergies, reflux disease, hyperlipidemia, and overweight state, who presents for follow-up consultation of his parkinsonism after an interim [name].  The patient is accompanied by his wife, Carl Zhang, today. I first met him at the request of his primary care physician on 07/27/2022, at which time he reported a several month history of intermittent trembling and stiffness as well as aching sensation in his right upper extremity.  Examination revealed evidence of right-sided predominant parkinsonism.  We discussed this possible diagnosis at length.  He was advised to proceed with a brain [name].  He had a [name] on 08/26/2022 and I reviewed the results: Impression: Marked loss of radiotracer activity within the bilateral putamen is a pattern typical of Parkinson's syndrome pathology.   Of note, [name] is not diagnostic of Parkinsonian syndromes, which remains a clinical diagnosis. [name] is an adjuvant test to aid in the clinical diagnosis of Parkinsonian syndromes. He was notified of the test results and offered a follow-up appointment to discuss symptomatic treatment options.  Today, 09/21/2022: He reports more or less stable.  No new symptoms, tremor has fluctuated on the right side, sometimes worse, fine motor skills or impaired on the right, he has noticed recent difficulty using a manual toothbrush on vacation.  He usually uses an electric toothbrush without problems.  He has not noticed any symptoms on the left, he does use a computer mouse now on the left.  He has not had any falls, no recent issues with constipation but has a longstanding history of reflux symptoms, usually well-controlled with pantoprazole which he takes on an as-needed basis at this point.  His wife endorses that he snores and has had the occasional gasping sound at  night.  He has never had a sleep study.  He would be willing to pursue sleep testing.  The patient's allergies, current medications, family history, past medical history, past social history, past surgical history and problem list were reviewed and updated as appropriate.   Previously:   07/27/22: (He) reports intermittent trembling and stiffness as well as aching in his right upper extremity since January 2024.  His pain is not a neck pain and does not radiate from the shoulder or neck down to the arm, it is a forearm achiness, sometimes stiffness and jitteriness.  He feels that he may suppress the tremor sometimes when not painful attention, he has had trouble navigating his computer mouse and has had difficulty with fine motor skills, has found himself doing more things with the nondominant left hand lately.  His handwriting is changed, it has become smaller and more sloppy.  He has not fallen, no balance issues, denies any lightheadedness but does report being a little bit more careful when showering as far as his balance.  He denies any vertiginous symptoms or recurrent headaches, denies any sudden onset of one-sided weakness or numbness or tingling or droopy face or slurring of speech, he denies family history of tremor or Parkinson's disease.  His dad lived to be 37 and had a history of traumatic brain injury, epilepsy and stroke.  He was in a nursing home for the last 10 years of his life.  His mom lived to be 72 and had peritoneal cancer.  He has 2 younger sisters, neither 1 with any tremor issues.  He lives with  his wife and 2 young children, ages 2 and 25.  He works full-time as a Occupational hygienist.  He is a non-smoker and limits his caffeine to 1 cup of coffee typically, no daily tea or soda.  He tries to hydrate well with water.  He does not drink any alcohol.  He denies any prodromal illness, had a colonoscopy recently.  He had a bout of bronchitis and concern for pneumonia at the end of last year  and beginning of this year.  He was treated with antibiotics and prednisone as well as an inhaler which he no longer uses.   I reviewed your office note from 06/09/2022.  He reported right hand pain and tremors since January 2024 at the time.  He has not had any recent blood work.     His Past Medical History Is Significant For: Past Medical History:  Diagnosis Date   GERD (gastroesophageal reflux disease)    Hx of colonoscopy 03/06/2022   Seasonal allergies     His Past Surgical History Is Significant For: Past Surgical History:  Procedure Laterality Date   HEMORRHOID SURGERY     NERVE REPAIR Left 05/21/2015   Procedure: LEFT SMALL FINGER DIGITAL NERVE REPAIR;  Surgeon: Mack Hook, MD;  Location: [facility];  Service: Orthopedics;  Laterality: Left;    His Family History Is Significant For: Family History  Problem Relation Age of Onset   Parkinson's disease Neg Hx    Tremor Neg Hx     His Social History Is Significant For: Social History   Socioeconomic History   Marital status: Single    Spouse name: Not on file   Number of children: Not on file   Years of education: Not on file   Highest education level: Not on file  Occupational History   Not on file  Tobacco Use   Smoking status: Never   Smokeless tobacco: Never  Vaping Use   Vaping Use: Never used  Substance and Sexual Activity   Alcohol use: No   Drug use: No   Sexual activity: Not on file  Other Topics Concern   Not on file  Social History Narrative   Not on file   Social Determinants of Health   Financial Resource Strain: Not on file  Food Insecurity: Not on file  Transportation Needs: Not on file  Physical Activity: Not on file  Stress: Not on file  Social Connections: Not on file    His Allergies Are:  No Known Allergies:   His Current Medications Are:  Outpatient Encounter Medications as of 09/21/2022  Medication Sig   cetirizine (ZYRTEC ALLERGY) 10 MG tablet Take 1 tablet  (10 mg total) by mouth at bedtime.   fluticasone (FLONASE) 50 MCG/ACT nasal spray Place 1 spray into both nostrils daily.   pantoprazole (PROTONIX) 40 MG tablet Take 40 mg by mouth daily.   [DISCONTINUED] albuterol (VENTOLIN HFA) 108 (90 Base) MCG/ACT inhaler Inhale 2 puffs into the lungs every 6 (six) hours as needed for wheezing or shortness of breath (Cough). (Patient not taking: Reported on 09/21/2022)   [DISCONTINUED] guaifenesin (HUMIBID E) 400 MG TABS tablet Take 1 tablet 3 times daily as needed for chest congestion and cough (Patient not taking: Reported on 09/21/2022)   [DISCONTINUED] ipratropium (ATROVENT) 0.06 % nasal spray Place 2 sprays into both nostrils 3 (three) times daily. As needed for nasal congestion, runny nose (Patient not taking: Reported on 09/21/2022)   [DISCONTINUED] polyethylene glycol-electrolytes (NULYTELY) 420 g solution  Take by mouth. (Patient not taking: Reported on 09/21/2022)   [DISCONTINUED] predniSONE (DELTASONE) 20 MG tablet Take 2 tablets daily with breakfast. (Patient not taking: Reported on 09/21/2022)   [DISCONTINUED] promethazine-dextromethorphan (PROMETHAZINE-DM) 6.25-15 MG/5ML syrup Take 5 mLs by mouth 3 (three) times daily as needed for cough. (Patient not taking: Reported on 09/21/2022)   No facility-administered encounter medications on file as of 09/21/2022.  :  Review of Systems:  Out of a complete 14 point review of systems, all are reviewed and negative with the exception of these symptoms as listed below:   Review of Systems  Neurological:        RM 5 with spouse Carl Zhang Pt is well, reports he has noticed a increase in R hand tremor since last visit. Here to discuss treatment options.     Objective:  Neurological Exam  Physical Exam Physical Examination:   Vitals:   09/21/22 0848  BP: (!) 135/90  Pulse: 75    General Examination: The patient is a very pleasant 52 y.o. male in no acute distress. He appears well-developed and well-nourished and  well groomed.   HEENT: Normocephalic, atraumatic, pupils are equal, round and reactive to light, extraocular tracking is well-preserved, no obvious saccadic breakdown, no nystagmus noted.  Hearing is grossly intact.  Face is symmetric with slight facial masking noted, neck with mild nuchal rigidity, full range of passive motion, no carotid bruits.  Airway examination reveals no significant mouth dryness.  Tongue protrudes centrally and palate elevates symmetrically.  No lip, neck or jaw tremor.  Moderate airway crowding, tonsillar size of 3+ on the right and 2+ on the left.  No significant hypophonia, no dysarthria.     Chest: Clear to auscultation without wheezing, rhonchi or crackles noted.   Heart: S1+S2+0, regular and normal without murmurs, rubs or gallops noted.    Abdomen: Soft, non-tender and non-distended.   Extremities: There is no obvious swelling in the distal lower extremities bilaterally.    Skin: Warm and dry without trophic changes noted.    Musculoskeletal: exam reveals no obvious joint deformities.    Neurologically:  Mental status: The patient is awake, alert and oriented in all 4 spheres. His immediate and remote memory, attention, language skills and fund of knowledge are appropriate. There is no evidence of aphasia, agnosia, apraxia or anomia. Speech is clear with normal prosody and enunciation. Thought process is linear. Mood is normal and affect is normal.  Cranial nerves II - XII are as described above under HEENT exam.  Motor exam: Normal bulk, strength noted in all 4 extremities, tone mildly increased in the right wrist, slightly increased in the right elbow area, no significant increase in tone on the left, very slight intermittent resting tremor in the right upper extremity only.   No other resting tremor noted.  He has a very slight postural tremor in the right upper extremity only.  He has no significant action tremor, no intention tremor, no lower extremity tremor.     (On 07/27/2022: On Archimedes spiral drawing he has no significant trembling, handwriting with the right hand is legible, he can write cursive, not particularly micrographic, not particularly tremulous.)  Fine motor skills and coordination: Mild impairment of finger taps and hand movements as well as rapid alternating patting and foot taps on the right side, normal on the left side in the upper and lower extremities.   Cerebellar testing: No dysmetria or intention tremor. There is no truncal or gait ataxia.  No difficulty with  finger-to-nose, no difficulty with heel-to-shin.  Sensory exam: intact to light touch in the upper and lower extremities.   Gait, station and balance: He stands without difficulty, posture slightly stooped for age, he walks without any shuffling, mild decrease in arm swing on the right noted, otherwise no freezing, no start hesitation, good stride length and pace overall.   Assessment and Plan:  In summary, Carl Zhang is a very pleasant 52 year old male with an underlying medical history of allergies, reflux disease, hyperlipidemia, and overweight state, who presents for follow-up consultation of his history of tremors, dexterity issues and fine motor dyscontrol in the right upper extremity primarily.  Symptoms dates back to January 2024.  His wife reports that they had several viral illnesses in the recent past including RSV and COVID about 15 months ago.  History and examination are in keeping with right-sided predominant Parkinson's disease, no obvious atypical symptoms or findings.   His [name] from 08/26/22 showed marked loss of radiotracer activity within the bilateral putamen, supporting an underlying parkinsonian syndrome.  We talked about the importance of maintaining a healthy lifestyle and positive outlook.  He is reminded to try to make time for formal exercise on a regular basis and be mindful about constipation issues.  He is advised to stay well-hydrated and  well rested, we will proceed with a home sleep test to look into the possibility of underlying obstructive sleep apnea, as he snores and has had gasping sounds at night, moderately crowded airway is noted today.  If he has obstructive sleep apnea, I will likely recommend home AutoPap therapy. For now, for symptomatic treatment of his parkinsonism I suggest we start him on ropinirole which is a dopamine agonist.  I explained this treatment option to the patient and his wife at length.  I would like to start him on a low dose with gradual titration, starting at 0.25 mg strength 1 pill twice daily with gradual titration to 3 pills 3 times daily in the next few weeks.  We may increase it further after that.  He is advised to follow-up in about 3 months, sooner if needed.  We talked about expectations, possible common and some rare side effects as well, impulse control disorder or dopamine dysregulation was explained to them as well. I answered all the questions today and the patient and his wife were in agreement. I spent 40 minutes in total face-to-face time and in reviewing records during pre-charting, more than 50% of which was spent in counseling and coordination of care, reviewing test results, reviewing medications and treatment regimen and/or in discussing or reviewing the diagnosis of parkinsonism, sleep apnea risk, the prognosis and treatment options. Pertinent laboratory and imaging test results that were available during this visit with the patient were reviewed by me and considered in my medical decision making (see chart for details).

## 2022-09-29 ENCOUNTER — Ambulatory Visit: Payer: 59 | Admitting: Neurology

## 2022-09-29 DIAGNOSIS — G4733 Obstructive sleep apnea (adult) (pediatric): Secondary | ICD-10-CM

## 2022-09-29 DIAGNOSIS — G20C Parkinsonism, unspecified: Secondary | ICD-10-CM

## 2022-09-29 DIAGNOSIS — R0681 Apnea, not elsewhere classified: Secondary | ICD-10-CM

## 2022-09-29 DIAGNOSIS — Z9189 Other specified personal risk factors, not elsewhere classified: Secondary | ICD-10-CM

## 2022-09-29 DIAGNOSIS — R0683 Snoring: Secondary | ICD-10-CM

## 2022-10-07 NOTE — Progress Notes (Signed)
See procedure note.

## 2022-10-08 NOTE — Procedures (Signed)
   [facility]  HOME SLEEP TEST (Watch PAT) REPORT - Mail-out Device  STUDY DATE: 10/06/2022  DOB: 1970/07/23  MRN: [id number]  ORDERING CLINICIAN: Huston Foley, MD, PhD   REFERRING CLINICIAN: Orpha Bur, MD   CLINICAL INFORMATION/HISTORY: 52 year old male with an underlying medical history of allergies, reflux disease, hyperlipidemia, parkinsonism and overweight state, who reports snoring and episodes of gasping during sleep.   BMI: 26.8 kg/m  FINDINGS:   Sleep Summary:   Total Recording Time (hours, min): 7 hours, 41 min  Total Sleep Time (hours, min):  7 hours, 20 min  Percent REM (%):    16.7%   Respiratory Indices:   Calculated pAHI (per hour):  21/hour         REM pAHI:    19.3/hour       NREM pAHI: 21.4/hour  Central pAHI: 2.8/hour  Oxygen Saturation Statistics:    Oxygen Saturation (%) Mean: 96%   Minimum oxygen saturation (%):                 82%   O2 Saturation Range (%): 82 - 99%    O2 Saturation (minutes) <=88%: 0.1 min  Pulse Rate Statistics:   Pulse Mean (bpm):    59/min    Pulse Range (34 - 96/min)   IMPRESSION: OSA (obstructive sleep apnea)   RECOMMENDATION:  This home sleep test demonstrates moderate obstructive sleep apnea with a total AHI of 21/hour and O2 nadir of 82%.  Mild to moderate snoring was detected. Treatment with a positive airway pressure (PAP) device is recommended. The patient will be advised to proceed with an autoPAP titration/trial at home for now. A full night titration study may be considered to optimize treatment settings, monitor proper oxygen saturations and aid with improvement of tolerance and adherence, if needed down the road. Alternative treatment options may include a dental device through dentistry or orthodontics in selected patients or Inspire (hypoglossal nerve stimulator) in carefully selected patients (meeting inclusion criteria).  Concomitant weight loss is recommended (where  clinically appropriate). Please note that untreated obstructive sleep apnea may carry additional perioperative morbidity. Patients with significant obstructive sleep apnea should receive perioperative PAP therapy and the surgeons and particularly the anesthesiologist should be informed of the diagnosis and the severity of the sleep disordered breathing. The patient should be cautioned not to drive, work at heights, or operate dangerous or heavy equipment when tired or sleepy. Review and reiteration of good sleep hygiene measures should be pursued with any patient. Other causes of the patient's symptoms, including circadian rhythm disturbances, an underlying mood disorder, medication effect and/or an underlying medical problem cannot be ruled out based on this test. Clinical correlation is recommended.  The patient and his referring provider will be notified of the test results. The patient will be seen in follow up in sleep clinic at [facility].  I certify that I have reviewed the raw data recording prior to the issuance of this report in accordance with the standards of the American Academy of Sleep Medicine (AASM).    INTERPRETING PHYSICIAN:   Huston Foley, MD, PhD Medical Director, [facility]) Diplomat, ABPN (Neurology and Sleep)   [facility] [address] [place], [place] [address] [phone]

## 2022-10-08 NOTE — Addendum Note (Signed)
Addended by: Huston Foley on: 10/08/2022 02:16 PM   Modules accepted: Orders

## 2022-11-02 NOTE — Telephone Encounter (Signed)
New, Almon Register, CMA; Kathe Becton; New, Candie Chroman, Efraim Kaufmann; 1 other Received, thank you!       Previous Messages    ----- Message ----- From: Bobbye Morton, CMA Sent: 11/02/2022   4:12 PM EDT To: Kathyrn Sheriff; Santina Evans; Kathe Becton; * Subject: cpap                                          New orders have been placed for the above pt, DOB: [birth year] Thanks

## 2022-12-22 ENCOUNTER — Encounter: Payer: Self-pay | Admitting: Neurology

## 2022-12-22 ENCOUNTER — Ambulatory Visit (INDEPENDENT_AMBULATORY_CARE_PROVIDER_SITE_OTHER): Payer: 59 | Admitting: Neurology

## 2022-12-22 VITALS — BP 132/89 | HR 64 | Ht 76.0 in | Wt 228.0 lb

## 2022-12-22 DIAGNOSIS — Z789 Other specified health status: Secondary | ICD-10-CM | POA: Diagnosis not present

## 2022-12-22 DIAGNOSIS — G4733 Obstructive sleep apnea (adult) (pediatric): Secondary | ICD-10-CM | POA: Diagnosis not present

## 2022-12-22 DIAGNOSIS — G20C Parkinsonism, unspecified: Secondary | ICD-10-CM

## 2022-12-22 MED ORDER — ROPINIROLE HCL 1 MG PO TABS: 1.0000 mg | ORAL_TABLET | Freq: Three times a day (TID) | ORAL | 3 refills | Status: DC

## 2022-12-22 NOTE — Progress Notes (Signed)
Subjective:    Patient ID: Carl Zhang is a 52 y.o. male.  HPI    Interim history:   Carl Zhang is a 52 year old male with an underlying medical history of allergies, reflux disease, hyperlipidemia, and overweight state, who presents for follow-up consultation of his parkinsonism.  The patient is unaccompanied today.   I last saw him on 09/21/2022, at which time he reported feeling stable.  We talked about his [name] results.  He was advised to start generic Requip low-dose with gradual titration.  We talked about his sleep disturbance and the possibility of underlying sleep apnea.  He was advised to proceed with a sleep test.    He had a home sleep test through our office on 10/06/2022 which showed moderate obstructive sleep apnea with an AHI of 21/h, O2 nadir 82% with mild to moderate snoring detected.  He was advised to proceed with home AutoPap therapy.  His set up date was 11/12/2022.  He has any ResMed air sense 11 AutoSet machine.  His DME company is [employer].  Today, 12/22/2022: I reviewed his AutoPap compliance data from 11/22/2022 through 12/21/2022, which is a total of 30 days, during which time he used his machine 25 days with percent use days greater than 4 hours at 13%, indicating suboptimal compliance, average usage of 2 hours and 46 minutes, residual AHI at goal at 4.7/h, leak on the lower side with the 95th percentile at 4 L/min on a pressure setting of 7 to 12 cm with EPR of 3, 95th percentile of pressure at 11 cm.  He reports that he is still adjusting to treatment, he has gotten a little bit better with being consistent with putting it on.  He is not quite noticing much in the way of benefit from AutoPap therapy, if anything it is hard for him to tolerate the mask and the pressure.  He has nasal congestion and nasal drainage, sometimes his nose is too stuffy, he uses a fullface mask from [employer], F20, presumably large size.  He uses nasal allergy over-the-counter medications but  is planning to be more consistent with the Flonase.  Sometimes he has pooling of saliva in his mask which is annoying.  He is very motivated to not give up on AutoPap therapy and continue with it.  He would not mind trying a different mask, I was able to provide him with a sample pack for the F40 fullface mask from [employer] today.  He reports tolerating the ropinirole, he is up to 0.75 mg 3 times daily.  He does notice that the tremor is a little less.  He has had some nausea in the very beginning but tolerates the medication otherwise, no significant sleepiness noted.  No unwanted behaviors, no evidence of impulse control disorder.  He is agreeable to increasing it to a slightly higher dose.  He has had no significant new symptoms.  His wife has been very supportive thankfully.  He has no trouble pursuing his day-to-day activities, has utilized his left upper extremity a little bit more for certain tasks such as using the mouse at the computer.    The patient's allergies, current medications, family history, past medical history, past social history, past surgical history and problem list were reviewed and updated as appropriate.    Previously:   I first met him at the request of his primary care physician on 07/27/2022, at which time he reported a several month history of intermittent trembling and stiffness as well as  aching sensation in his right upper extremity.  Examination revealed evidence of right-sided predominant parkinsonism.  We discussed this possible diagnosis at length.  He was advised to proceed with a brain [name].  He had a [name] on 08/26/2022 and I reviewed the results: Impression: Marked loss of radiotracer activity within the bilateral putamen is a pattern typical of Parkinson's syndrome pathology.   Of note, [name] is not diagnostic of Parkinsonian syndromes, which remains a clinical diagnosis. [name] is an adjuvant test to aid in the clinical diagnosis of Parkinsonian  syndromes. He was notified of the test results and offered a follow-up appointment to discuss symptomatic treatment options.      07/27/22: (He) reports intermittent trembling and stiffness as well as aching in his right upper extremity since January 2024.  His pain is not a neck pain and does not radiate from the shoulder or neck down to the arm, it is a forearm achiness, sometimes stiffness and jitteriness.  He feels that he may suppress the tremor sometimes when not painful attention, he has had trouble navigating his computer mouse and has had difficulty with fine motor skills, has found himself doing more things with the nondominant left hand lately.  His handwriting is changed, it has become smaller and more sloppy.  He has not fallen, no balance issues, denies any lightheadedness but does report being a little bit more careful when showering as far as his balance.  He denies any vertiginous symptoms or recurrent headaches, denies any sudden onset of one-sided weakness or numbness or tingling or droopy face or slurring of speech, he denies family history of tremor or Parkinson's disease.  His dad lived to be 70 and had a history of traumatic brain injury, epilepsy and stroke.  He was in a nursing home for the last 10 years of his life.  His mom lived to be 72 and had peritoneal cancer.  He has 2 younger sisters, neither 1 with any tremor issues.  He lives with his wife and 2 young children, ages 2 and 34.  He works full-time as a Occupational hygienist.  He is a non-smoker and limits his caffeine to 1 cup of coffee typically, no daily tea or soda.  He tries to hydrate well with water.  He does not drink any alcohol.  He denies any prodromal illness, had a colonoscopy recently.  He had a bout of bronchitis and concern for pneumonia at the end of last year and beginning of this year.  He was treated with antibiotics and prednisone as well as an inhaler which he no longer uses.   I reviewed your office note from  06/09/2022.  He reported right hand pain and tremors since January 2024 at the time.  He has not had any recent blood work.    His Past Medical History Is Significant For: Past Medical History:  Diagnosis Date   GERD (gastroesophageal reflux disease)    Hx of colonoscopy 03/06/2022   Seasonal allergies     His Past Surgical History Is Significant For: Past Surgical History:  Procedure Laterality Date   HEMORRHOID SURGERY     NERVE REPAIR Left 05/21/2015   Procedure: LEFT SMALL FINGER DIGITAL NERVE REPAIR;  Surgeon: Mack Hook, MD;  Location: [facility];  Service: Orthopedics;  Laterality: Left;    His Family History Is Significant For: Family History  Problem Relation Age of Onset   Parkinson's disease Neg Hx    Tremor Neg Hx  His Social History Is Significant For: Social History   Socioeconomic History   Marital status: Single    Spouse name: Not on file   Number of children: Not on file   Years of education: Not on file   Highest education level: Not on file  Occupational History   Not on file  Tobacco Use   Smoking status: Never   Smokeless tobacco: Never  Vaping Use   Vaping status: Never Used  Substance and Sexual Activity   Alcohol use: No   Drug use: No   Sexual activity: Not on file  Other Topics Concern   Not on file  Social History Narrative   Caffeine: 1-2 cups/day   Social Determinants of Health   Financial Resource Strain: Not on file  Food Insecurity: Not on file  Transportation Needs: Not on file  Physical Activity: Not on file  Stress: Not on file  Social Connections: Not on file    His Allergies Are:  No Known Allergies:   His Current Medications Are:  Outpatient Encounter Medications as of 12/22/2022  Medication Sig   cetirizine (ZYRTEC ALLERGY) 10 MG tablet Take 1 tablet (10 mg total) by mouth at bedtime.   fluticasone (FLONASE) 50 MCG/ACT nasal spray Place 1 spray into both nostrils daily.   pantoprazole  (PROTONIX) 40 MG tablet Take 40 mg by mouth daily.   rOPINIRole (REQUIP) 0.25 MG tablet Take 3 tablets (0.75 mg total) by mouth 3 (three) times daily. Follow instructions provided in MyChart   No facility-administered encounter medications on file as of 12/22/2022.  :  Review of Systems:  Out of a complete 14 point review of systems, all are reviewed and negative with the exception of these symptoms as listed below:   Review of Systems  Neurological:        Patient is here alone for initial cpap follow-up and parkinsonism. He hasn't gotten used to the cpap yet and feels he is sleeping less with it on than he would without it. He reports he still has a tremor, stiffness, soreness in his right arm but he does feel the medication is helping. ESS 9    Objective:  Neurological Exam  Physical Exam Physical Examination:   Vitals:   12/22/22 1029  BP: 132/89  Pulse: 64    General Examination: The patient is a very pleasant 52 y.o. male in no acute distress. He appears well-developed and well-nourished and well groomed.   HEENT: Normocephalic, atraumatic, pupils are equal, round and reactive to light, extraocular tracking is well-preserved, no obvious saccadic breakdown, no nystagmus noted.  Hearing is grossly intact.  Face is symmetric with slight facial masking noted, neck with mild nuchal rigidity, full range of passive motion, no carotid bruits, all stable.  Airway examination reveals no significant mouth dryness.  Tongue protrudes centrally and palate elevates symmetrically.  No lip, neck or jaw tremor.  Moderate airway crowding, tonsillar size of 3+ on the right and 2+ on the left.  No significant hypophonia, no dysarthria.  No significant sialorrhea.   Chest: Clear to auscultation without wheezing, rhonchi or crackles noted.   Heart: S1+S2+0, regular and normal without murmurs, rubs or gallops noted.    Abdomen: Soft, non-tender and non-distended.   Extremities: There is no obvious  swelling in the distal lower extremities bilaterally.    Skin: Warm and dry without trophic changes noted.    Musculoskeletal: exam reveals no obvious joint deformities.    Neurologically:  Mental status: The patient  is awake, alert and oriented in all 4 spheres. His immediate and remote memory, attention, language skills and fund of knowledge are appropriate. There is no evidence of aphasia, agnosia, apraxia or anomia. Speech is clear with normal prosody and enunciation. Thought process is linear. Mood is normal and affect is normal.  Cranial nerves II - XII are as described above under HEENT exam.  Motor exam: Normal bulk, strength noted in all 4 extremities, tone mildly increased in the right wrist, slightly increased in the right elbow area, no significant increase in tone on the left, very slight intermittent resting tremor in the right upper extremity only.   No other resting tremor noted.  He has a very slight postural tremor in the right upper extremity only.  He has no significant action tremor, no intention tremor, no lower extremity tremor.    (On 07/27/2022: On Archimedes spiral drawing he has no significant trembling, handwriting with the right hand is legible, he can write cursive, not particularly micrographic, not particularly tremulous.)   Fine motor skills and coordination: Mild impairment of finger taps and hand movements as well as rapid alternating patting and foot taps on the right side, normal on the left side in the upper and lower extremities.   Cerebellar testing: No dysmetria or intention tremor. There is no truncal or gait ataxia.  No difficulty with finger-to-nose, no difficulty with heel-to-shin.   Sensory exam: intact to light touch in the upper and lower extremities.    Gait, station and balance: He stands without difficulty, posture slightly stooped for age, he walks without any shuffling, mild decrease in arm swing on the right noted, otherwise no freezing, no  start hesitation, good stride length and pace overall.   Assessment and Plan:  In summary, Carl Zhang is a very pleasant 52 year old male with an underlying medical history of allergies, reflux disease, hyperlipidemia, and overweight state, who presents for follow-up consultation of his right-sided predominant Parkinson's disease with symptoms dating back to January 2024.  His [name] from 08/26/22 showed marked loss of radiotracer activity within the bilateral putamen, supporting an underlying parkinsonian syndrome.  He had a home sleep test on 10/06/2022 which showed moderate obstructive sleep apnea with an AHI of 21/h, O2 nadir 82% with mild to moderate snoring detected.  He has been on AutoPap therapy since late August 2024.  He is still struggling with compliance, has not adjusted fully to treatment, has had some challenges including nasal congestion, allergy symptoms, drainage, and sialorrhea.  He is motivated to continue with treatment and highly commended for it.  He is advised to trial a fullface mask from [employer] called F40, I was able to provide him with a sample mask today.  He uses a more traditional style fullface mask called F30 currently.  If he likes the new mask, he can reorder it through his DME provider.  We talked about his sleep test results and reviewed his compliance data in detail.  As far as the ropinirole, he is tolerating the medication and has noticed some benefit from it.  He is advised to increase the dose to 1 mg strength, take 1 pill 3 times daily.  I adjusted his prescription for 90-day prescription with refills.  He is commended for maintaining a healthy lifestyle, he is working full-time.  He is advised to follow-up routinely in this clinic to see one of our nurse practitioners in 6 to 8 months, sooner if needed.  I answered all his questions today  and he was in agreement with our plan.  I spent 45 minutes in total face-to-face time and in reviewing records during  pre-charting, more than 50% of which was spent in counseling and coordination of care, reviewing test results, reviewing medications and treatment regimen and/or in discussing or reviewing the diagnosis of Parkinson's disease, OSA, the prognosis and treatment options. Pertinent laboratory and imaging test results that were available during this visit with the patient were reviewed by me and considered in my medical decision making (see chart for details).

## 2022-12-22 NOTE — Patient Instructions (Addendum)
It was nice to see you again today.  I am glad to hear that you are tolerating the ropinirole.   As discussed, I would like to increase it to 1 mg strength take 1 pill 3 times daily.  Please continue to work on your consistency with your AutoPap use, it looks like you have used it a little better in the past few days, I think it is getting there, you are not quite there when it comes to insurance required compliance, using it consistently more than 4 hours but I think you have improved.   Try the fullface mask I provided today as a sample, this is an under the nose style fullface mask from [employer] called F40, you have a starter pack with 3 different sizes.  If you like this mask, you can reorder it through [employer] [facility].  If you would like this type but not this particular brand, you can always talk to your DME provider about a similar style from a different brand.    Follow-up in about 6 to 8 months to see one of our nurse practitioners, I will adjust your prescription for ropinirole to increase it to 1 mg strength 1 pill 3 times daily.

## 2023-03-22 ENCOUNTER — Other Ambulatory Visit: Payer: Self-pay | Admitting: Anesthesiology

## 2023-03-22 DIAGNOSIS — G20C Parkinsonism, unspecified: Secondary | ICD-10-CM

## 2023-03-22 MED ORDER — ROPINIROLE HCL 1 MG PO TABS: 1.0000 mg | ORAL_TABLET | Freq: Three times a day (TID) | ORAL | 3 refills | Status: DC

## 2023-07-08 NOTE — Progress Notes (Signed)
 [facility] [address] [place]. [place] [address] [phone]       OFFICE FOLLOW UP NOTE  [name] Date of Birth:  1971/01/09 Medical Record Number:  [id number]    Primary neurologist: Dr. Omar Bibber Reason for visit: CPAP and PD follow-up    SUBJECTIVE:   CHIEF COMPLAINT:  Chief Complaint  Patient presents with   Follow-up    RM 3, Pt alone. F/u from visit w/Dr. Omar Bibber 6 mos ago. Pt reports speech has become affected now.    Follow-up visit:  Prior visit:  Brief HPI:   Carl Zhang is a 53 y.o. male who was initially evaluated by Dr. Omar Bibber 07/2022 for RUE trembling, stiffness and achiness since 03/2022.  [name]  consistent with Parkinson's disease and was started on ropinirole  around 09/2022.  He also mentioned symptoms consistent with sleep apnea at follow-up visit in 09/2022 and recommended proceeding with sleep study which was completed in 09/2022 which showed moderate OSA with total AHI 21/h and O2 today of 82%, AutoPap set up 10/2022.  At prior visit with Dr. Omar Bibber, noted suboptimal compliance due to difficulty tolerating mask and pressure although optimal residual AHI with use, recommend trial of fullface mask F40.  Noted some improvement of tremors on ropinirole , dosage adjusted to 1 mg 3 times daily.     Interval history:  Returns today for follow-up visit unaccompanied.  He reports gradual worsening parkinsonism since prior visit.  Reports gradual worsening of RUE tremor, greater difficulty holding onto objects, greater difficulty writing, print is much smaller.  Difficulty typing at work.  Has been using left hand more when able such as using the computer mouse. Started to experience difficulty with his speech about 6 months ago and noted gradual worsening over the past 3 to 4 months.  He knows what he wants to say but has a hard time getting the word out, stuttering, and hesitancy, worse with anxiety. Feels he needs to speak quicker and  rapid in order to get out what he needs to say. Denies any changes in his gait, no recent falls, no swallowing difficulty, denies vision changes. 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 He recently was seen by a personal trainer at [employer] who specializes in Parkinson's but has had a hard time getting visits scheduled as he works 9am-5pm and has 2 young children (3yo and 5yo). He questions pursuing long-term disability.  Reports overall tolerating ropinirole  1mg  well but frequently forgets afternoon dose. He feels like medication is helping but also difficult to fully determine this.  Denies side effects with current dose.  Did discuss possibly pursuing DBS which he is understandably hesitant about but did agree to evaluation/discussion with academic center.  CPAP compliance report as below.  Due to length of time discussing Parkinson's disease, was unable to discuss CPAP.         ROS:   14 system review of systems performed and negative with exception of those listed in HPI  PMH:  Past Medical History:  Diagnosis Date   GERD (gastroesophageal reflux disease)    Hx of colonoscopy 03/06/2022   Seasonal allergies     PSH:  Past Surgical History:  Procedure Laterality Date   HEMORRHOID SURGERY     NERVE REPAIR Left 05/21/2015   Procedure: LEFT SMALL FINGER DIGITAL NERVE REPAIR;  Surgeon: Rober Chimera, MD;  Location: [facility];  Service: Orthopedics;  Laterality:  Left;    Social History:  Social History   Socioeconomic History   Marital status: Single    Spouse name: Not on file   Number of children: Not on file   Years of education: Not on file   Highest education level: Not on file  Occupational History   Not on file  Tobacco Use   Smoking status: Never   Smokeless tobacco: Never  Vaping Use   Vaping status: Never Used  Substance and Sexual Activity   Alcohol use: No   Drug use: No    Sexual activity: Not on file  Other Topics Concern   Not on file  Social History Narrative   Caffeine: 1-2 cups/day   Social Drivers of Corporate investment banker Strain: Not on file  Food Insecurity: Not on file  Transportation Needs: Not on file  Physical Activity: Not on file  Stress: Not on file  Social Connections: Not on file  Intimate Partner Violence: Not on file    Family History:  Family History  Problem Relation Age of Onset   Parkinson's disease Neg Hx    Tremor Neg Hx     Medications:   Current Outpatient Medications on File Prior to Visit  Medication Sig Dispense Refill   famotidine (PEPCID) 10 MG tablet Take 10 mg by mouth daily.     fluticasone  (FLONASE ) 50 MCG/ACT nasal spray Place 1 spray into both nostrils daily. 47.4 mL 1   rOPINIRole  (REQUIP ) 1 MG tablet Take 1 tablet (1 mg total) by mouth 3 (three) times daily. 270 tablet 3   cetirizine  (ZYRTEC  ALLERGY) 10 MG tablet Take 1 tablet (10 mg total) by mouth at bedtime. 90 tablet 1   pantoprazole (PROTONIX) 40 MG tablet Take 40 mg by mouth daily. (Patient not taking: Reported on 07/12/2023)     No current facility-administered medications on file prior to visit.    Allergies:  No Known Allergies    OBJECTIVE:  Physical Exam  Vitals:   07/12/23 1117  BP: (!) 148/94  Pulse: 74  Weight: 226 lb 12.8 oz (102.9 kg)  Height: 6\' 2"  (1.88 m)   Body mass index is 29.12 kg/m. No results found.  General: well developed, well nourished, very pleasant middle-age Caucasian male, seated, in no evident distress  Neurologic Exam Mental Status: Awake and fully alert. Oriented to place and time. Recent and remote memory intact. Attention span, concentration and fund of knowledge appropriate. Mood and affect appropriate.  Difficulty initiating speech, speaks quickly, frequent stuttering and hesitancy of speech, word finding difficulty.  Cranial Nerves: Pupils equal, briskly reactive to light. Extraocular  movements full without nystagmus. Visual fields full to confrontation. Hearing intact. Facial sensation intact. Face, tongue, palate moves normally and symmetrically.  Motor: Mild intermittent RUE resting tremor, mild to moderate postural tremor, no tremor noted LUE.  Increased rigidity of RUE, very slight LUE.  Mild to moderate bradykinesia RUE.  No lower extremity tremor.  Gait and Station: Arises from chair without difficulty.  Posture slightly stooped for age. Gait demonstrates adequate stride length and step height without shuffling, mildly decreased arm swing on the right, mild imbalance with turns       ASSESSMENT/PLAN: Carl Zhang is a 53 y.o. year old male    Parkinson's disease:  Gradual worsening of RUE tremor and rigidity, onset of speech difficulty over the past 6 months gradually worsening.  Recommend transitioning ropinirole  IR to ER 3mg  nightly (difficulty remembering afternoon dose) Referral placed to  academic center for further recommendations and discuss DBS Referral placed to [facility] Encouraged participation with personal trainer, consider formal PT/OT evaluations but patient would like to try personal trainer first He questions LTD as symptoms affecting ability to work. Advised Dr. Omar Bibber typically recommends completion of disability by PCP. Advised hopefully with more consistent dosing of ropinirole  and working with therapies, symptoms will improve  Discussed importance of routine physical activity and exercise as well as adequate hydration [name]  08/2022 marked loss of radiotracer activity within the bilateral putamen, typical pattern of Parkinson syndrome pathology  OSA on CPAP :  Did not discuss today due to lengthy discussion for Parkinson's disease; did review past 30 day CPAP download which showed satisfactory usage and optimal residual AHI Can follow up with CPAP at follow up visit Continue current pressure settings of 7-12 with EPR 3.    HST 09/2022 moderate sleep apnea, AutoPap set up 10/2022     Follow up in 4 months with Dr. Omar Bibber (sooner visit requested by patient) or call earlier if needed   CC:  PCP: Arlys Berke, MD    I spent 40 minutes of face-to-face and non-face-to-face time with patient.  This included previsit chart review, lab review, study review, order entry, electronic health record documentation, patient education and discussion regarding above diagnoses and treatment plan and answered all other questions to patient's satisfaction  Johny Nap, [facility]  [facility] [address] [place], [place] [address]  Phone [phone] Fax [phone] Note: This document was prepared with digital dictation and possible smart phrase technology. Any transcriptional errors that result from this process are unintentional.

## 2023-07-12 ENCOUNTER — Encounter: Payer: Self-pay | Admitting: Adult Health

## 2023-07-12 ENCOUNTER — Telehealth: Payer: Self-pay | Admitting: Adult Health

## 2023-07-12 ENCOUNTER — Ambulatory Visit (INDEPENDENT_AMBULATORY_CARE_PROVIDER_SITE_OTHER): Payer: 59 | Admitting: Adult Health

## 2023-07-12 VITALS — BP 148/94 | HR 74 | Ht 74.0 in | Wt 226.8 lb

## 2023-07-12 DIAGNOSIS — G4733 Obstructive sleep apnea (adult) (pediatric): Secondary | ICD-10-CM | POA: Diagnosis not present

## 2023-07-12 DIAGNOSIS — G20A1 Parkinson's disease without dyskinesia, without mention of fluctuations: Secondary | ICD-10-CM | POA: Diagnosis not present

## 2023-07-12 DIAGNOSIS — R4782 Fluency disorder in conditions classified elsewhere: Secondary | ICD-10-CM

## 2023-07-12 MED ORDER — ROPINIROLE HCL 3 MG PO TABS: 3.0000 mg | ORAL_TABLET | Freq: Every day | ORAL | 5 refills | Status: DC

## 2023-07-12 NOTE — Patient Instructions (Addendum)
 Your Plan:   Start Requip  3mg  nightly - this is an extended release form so you only need to take once per day  Referral placed for speech therapy at [facility] - they will call you to schedule  Start working with personal trainer - please let me know if you would like to do formal physical and occupational therapy   Referral placed for evaluation/discuss on DBS (deep brain stimulation)       Follow up with Dr. Omar Bibber in 4 months or call earlier if needed      Thank you for coming to see us  at [facility]. I hope we have been able to provide you high quality care today.  You may receive a patient satisfaction survey over the next few weeks. We would appreciate your feedback and comments so that we may continue to improve ourselves and the health of our patients.

## 2023-07-12 NOTE — Telephone Encounter (Signed)
Referral for neurology fax to [facility]. Phone: [phone], Fax: [phone].

## 2023-07-29 ENCOUNTER — Other Ambulatory Visit: Payer: Self-pay

## 2023-07-29 ENCOUNTER — Ambulatory Visit: Attending: Adult Health

## 2023-07-29 DIAGNOSIS — R482 Apraxia: Secondary | ICD-10-CM | POA: Insufficient documentation

## 2023-07-29 DIAGNOSIS — G20A1 Parkinson's disease without dyskinesia, without mention of fluctuations: Secondary | ICD-10-CM | POA: Diagnosis not present

## 2023-07-29 DIAGNOSIS — R4782 Fluency disorder in conditions classified elsewhere: Secondary | ICD-10-CM | POA: Diagnosis not present

## 2023-07-29 DIAGNOSIS — R4789 Other speech disturbances: Secondary | ICD-10-CM | POA: Diagnosis present

## 2023-07-29 NOTE — Therapy (Addendum)
 OUTPATIENT SPEECH LANGUAGE PATHOLOGY PARKINSON'S EVALUATION   Patient Name: Carl Zhang MRN: [id number] DOB:27-Apr-1970, 53 y.o., male Today's Date: 07/29/2023  PCP: Delayne Artist PARAS, MD REFERRING PROVIDER: Whitfield Raisin, NP  END OF SESSION:  End of Session - 07/29/23 1713     Visit Number 1    Number of Visits 17    Date for SLP Re-Evaluation 10/01/23    SLP Start Time 1450    SLP Stop Time  1531    SLP Time Calculation (min) 41 min    Activity Tolerance Patient tolerated treatment well             Past Medical History:  Diagnosis Date   GERD (gastroesophageal reflux disease)    Hx of colonoscopy 03/06/2022   Seasonal allergies    Past Surgical History:  Procedure Laterality Date   HEMORRHOID SURGERY     NERVE REPAIR Left 05/21/2015   Procedure: LEFT SMALL FINGER DIGITAL NERVE REPAIR;  Surgeon: Alm Hummer, MD;  Location: [facility];  Service: Orthopedics;  Laterality: Left;   There are no active problems to display for this patient.   ONSET DATE: Diagnosed June 2024; Script dated 07/12/23  REFERRING DIAG: G20.A1 (ICD-10-CM) - Parkinson's disease without dyskinesia or fluctuating manifestations (HCC) R47.82 (ICD-10-CM) - Fluency disorder associated with underlying disease  THERAPY DIAG:  Apraxia - Plan: SLP plan of care cert/re-cert  Apraxia of speech - Plan: SLP plan of care cert/re-cert  Speech dysfluency - Plan: SLP plan of care cert/re-cert  Rationale for Evaluation and Treatment: Rehabilitation  SUBJECTIVE:   SUBJECTIVE STATEMENT: I maybe had a little stumble with my normal speech pattern, but it's obviously gotten worse.  Pt accompanied by: self  PERTINENT HISTORY: Interval history: Neuro - 07/12/23 Returns today for follow-up visit unaccompanied.  He reports gradual worsening parkinsonism since prior visit.  Reports gradual worsening of RUE tremor, greater difficulty holding onto objects, greater difficulty writing, print is  much smaller.  Difficulty typing at work.  Has been using left hand more when able such as using the computer mouse. Started to experience difficulty with his speech about 6 months ago and noted gradual worsening over the past 3 to 4 months.  He knows what he wants to say but has a hard time getting the word out, stuttering, and hesitancy, worse with anxiety. Feels he needs to speak quicker and rapid in order to get out what he needs to say. Denies any changes in his gait, no recent falls, no swallowing difficulty, denies vision changes. 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 He recently was seen by a personal trainer at [employer] who specializes in Parkinson's but has had a hard time getting visits scheduled as he works 9am-5pm and has 2 young children (3yo and 5yo). He questions pursuing long-term disability.  Reports overall tolerating ropinirole  1mg  well but frequently forgets afternoon dose. He feels like medication is helping but also difficult to fully determine this.  Denies side effects with current dose.  Did discuss possibly pursuing DBS which he is understandably hesitant about but did agree to evaluation/discussion with academic center.  PAIN:  Are you having pain? No  FALLS: Has patient fallen in last 6 months?  No  LIVING ENVIRONMENT: Lives with: lives with their family (wife and 4 and 74 year old) Lives in: House/apartment  PLOF:  Level of assistance: Independent with ADLs, Independent with IADLs Employment: Environmental education officer employment  PATIENT GOALS: I would like to have some tools to help me slow down and think about what I'm going to say.  OBJECTIVE:  Note: Objective measures were completed at Evaluation unless otherwise noted.  DIAGNOSTIC FINDINGS:  [name] results 08/26/22 IMPRESSION: Marked loss of radiotracer activity within the bilateral putamen is a pattern typical of Parkinson's syndrome pathology.    Of note, [name]  is not diagnostic of Parkinsonian syndromes, which remains a clinical diagnosis. [name]  is an adjuvant test to aid in the clinical diagnosis of Parkinsonian syndromes.   COGNITION: Overall cognitive status: Within functional limits for tasks assessed  MOTOR SPEECH: Overall motor speech: impaired Level of impairment: Word, Phrase, Sentence, and Conversation Respiration: thoracic breathing and diaphragmatic/abdominal breathing Phonation: normal Resonance: WFL Articulation: Impaired: word, phrase, sentence, and conversation Intelligibility: Intelligible Motor planning: Impaired: aware Motor speech errors: aware Interfering components: dx PD Effective technique: slow rate and over articulate  ORAL MOTOR EXAMINATION: Overall status: Impaired: Labial: Bilateral (Coordination) Lingual: Bilateral (Coordination) Facial: Bilateral (ROM) Comments: slight hypomimia   OBJECTIVE VOICE ASSESSMENT: Oral reading (passage) loudness average: 71 dB Oral reading loudness range: 53-80 dB Conversational loudness average: 70 dB Conversational loudness range: 54-80 dB Voice quality: normal Stimulability trials: Given min SLP modeling, speech rate decreased to WNL pace at recited speech level (Preamble to [place]  Constitution). In a 15 second sample, after 7 seconds pace increased for 3-4 seconds until pt noticed this and then slowed back down to WNL the last 3-4 seconds.   Comments: Pt shows that he is a good candidate for skilled ST for rate reduction given his ability to initially slow his rate, awareness of a faster rate, and the ability to slow rate when he noticed faster rate. Multiple reps of phonemes, syllables, and whole words were noted in conversational speech, at the frequency and severity they were distracting to this listener during the evaluation.  Completed audio recording of patients baseline voice without cueing from SLP: Yes  Pt does not report difficulty with swallowing  which does not warrant further evaluation.  PATIENT REPORTED OUTCOME MEASURES (PROM): Communication Effectiveness Survey: provided in first 1-2 sessions                                                                                                                            TREATMENT DATE:   07/29/23: discussed possibility of OT eval and benefits with pt. He will tell SLP about this next session. SLP explained how pt should practice at home at this time: read out loud for 30 seconds x10, while recording, then listen back to his recording to incr awareness of faster than WNL speech. Told pt he may see some carryover with this routine.   PATIENT EDUCATION: Education details: role of SLP in ST, consider OT eval Person educated: Patient Education method: Explanation, Demonstration, and Verbal cues Education comprehension: verbalized understanding, returned demonstration, verbal cues required, and needs further education  HOME EXERCISE PROGRAM: See treatment date  GOALS: Goals reviewed with patient? Yes in general  SHORT TERM GOALS: Target date: 09/03/23 due to first visit date  Pt will report practicing at home at least 5 days/week for first 2 weeks Baseline: Goal status: INITIAL  2.  Pt will demo slower rate of speech (WNL rate) in sentence responses 18/20 in 2 sessions Baseline:  Goal status: INITIAL  3.  Pt will demo slower rate of speech in 2 minutes of conversation, 80% of the time, in 2 sessions Baseline:  Goal status: INITIAL  4.  Pt will tell SLP three compensatory measures to use to decr internal tension/stress/anxiety about talking, in 3 sessions Baseline:  Goal status: INITIAL   LONG TERM GOALS: Target date: 10/01/23  Pt will improve PROM Baseline:  Goal status: INITIAL  2.  Pt will demo WNL rate of speech in 10 minutes of mod complex social conversation, 75% of the time, over 3 sessions Baseline:  Goal status: INITIAL  3.  Pt will demo WNL rate of speech  in 10 minutes of work-like conversation, 75% of the time, over 3 sessions Baseline:  Goal status: INITIAL  4.  Pt will demo knowledge of 3 websites where to access accurate information about PD Baseline:  Goal status: INITIAL  4.  Pt will tell SLP he successfully used compensatory measure/s to decr internal tension/stress/anxiety about talking, between 3 sessions Baseline:  Goal status: INITIAL  ASSESSMENT:  CLINICAL IMPRESSION: Patient is a 53 y.o. M who was seen today for assessment of speech and language in light of dx of PD. Pt demonstrated multiple repetitions of initial phonemes, syllables, and whole words in conversation today, at the frequency that it was distracting to this listener. Severity of this disorder is considered moderate to severe at this time. He conveys to SLP he has conference calls and meetings regularly and is disappointed in how his speech sounds to other people.   OBJECTIVE IMPAIRMENTS: Objective impairments include apraxia. These impairments are limiting patient from household responsibilities, ADLs/IADLs, and effectively communicating at home and in community.Factors affecting potential to achieve goals and functional outcome are medical prognosis and severity of impairments.. Patient will benefit from skilled SLP services to address above impairments and improve overall function.  REHAB POTENTIAL: Excellent  PLAN:  SLP FREQUENCY: 1-2x/week  SLP DURATION: 8 weeks  PLANNED INTERVENTIONS: Environmental controls, Cueing hierachy, Internal/external aids, Functional tasks, SLP instruction and feedback, Compensatory strategies, Patient/family education, and [id number] Treatment of speech (30 or 45 min)     Scout Guyett, CCC-SLP 07/29/2023, 5:14 PM

## 2023-08-24 ENCOUNTER — Ambulatory Visit: Attending: Adult Health

## 2023-08-24 DIAGNOSIS — R4789 Other speech disturbances: Secondary | ICD-10-CM | POA: Insufficient documentation

## 2023-08-24 DIAGNOSIS — R482 Apraxia: Secondary | ICD-10-CM | POA: Insufficient documentation

## 2023-08-24 NOTE — Therapy (Signed)
 OUTPATIENT SPEECH LANGUAGE PATHOLOGY PARKINSON'S TREATMENT   Patient Name: Carl Zhang MRN: [id number] DOB:04-Dec-1970, 53 y.o., male Today's Date: 08/24/2023  PCP: Arlys Berke, MD REFERRING PROVIDER: Johny Nap, NP  END OF SESSION:  End of Session - 08/24/23 1625     Visit Number 2    Number of Visits 17    Date for SLP Re-Evaluation 10/01/23    SLP Start Time 1620    SLP Stop Time  1700    SLP Time Calculation (min) 40 min    Activity Tolerance Patient tolerated treatment well             Past Medical History:  Diagnosis Date   GERD (gastroesophageal reflux disease)    Hx of colonoscopy 03/06/2022   Seasonal allergies    Past Surgical History:  Procedure Laterality Date   HEMORRHOID SURGERY     NERVE REPAIR Left 05/21/2015   Procedure: LEFT SMALL FINGER DIGITAL NERVE REPAIR;  Surgeon: Rober Chimera, MD;  Location: [facility];  Service: Orthopedics;  Laterality: Left;   There are no active problems to display for this patient.   ONSET DATE: Diagnosed June 2024; Script dated 07/12/23  REFERRING DIAG: G20.A1 (ICD-10-CM) - Parkinson's disease without dyskinesia or fluctuating manifestations (HCC) R47.82 (ICD-10-CM) - Fluency disorder associated with underlying disease  THERAPY DIAG:  Apraxia of speech  Apraxia  Speech dysfluency  Rationale for Evaluation and Treatment: Rehabilitation  SUBJECTIVE:   SUBJECTIVE STATEMENT: "I felt like "slowing down" worked best."  Pt accompanied by: self  PERTINENT HISTORY: Interval history: Neuro - 07/12/23 Returns today for follow-up visit unaccompanied.  He reports gradual worsening parkinsonism since prior visit.  Reports gradual worsening of RUE tremor, greater difficulty holding onto objects, greater difficulty writing, print is much smaller.  Difficulty typing at work.  Has been using left hand more when able such as using the computer mouse. Started to experience difficulty with his speech  about 6 months ago and noted gradual worsening over the past 3 to 4 months.  He knows what he wants to say but has a hard time getting the word out, stuttering, and hesitancy, worse with anxiety. Feels he needs to speak quicker and rapid in order to get out what he needs to say. Denies any changes in his gait, no recent falls, no swallowing difficulty, denies vision changes. 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 He recently was seen by a personal trainer at [employer] who specializes in Parkinson's but has had a hard time getting visits scheduled as he works 9am-5pm and has 2 young children (3yo and 5yo). He questions pursuing long-term disability.  Reports overall tolerating ropinirole  1mg  well but frequently forgets afternoon dose. He feels like medication is helping but also difficult to fully determine this.  Denies side effects with current dose.  Did discuss possibly pursuing DBS which he is understandably hesitant about but did agree to evaluation/discussion with academic center.  PAIN:  Are you having pain? No  FALLS: Has patient fallen in last 6 months?  No  LIVING ENVIRONMENT: Lives with: lives with their family (wife and 61 and 67 year old) Lives in: House/apartment  PLOF:  Level of assistance: Independent with ADLs, Independent with IADLs Employment: Full-time employment  PATIENT GOALS: "I would like to have some tools to help me slow down and think about what I'm going to say."  OBJECTIVE:  Note: Objective measures were completed  at Evaluation unless otherwise noted.  DIAGNOSTIC FINDINGS:  [name] results 08/26/22 IMPRESSION: Marked loss of radiotracer activity within the bilateral putamen is a pattern typical of Parkinson's syndrome pathology.   Of note, [name]  is not diagnostic of Parkinsonian syndromes, which remains a clinical diagnosis. [name]  is an adjuvant test to aid in the clinical diagnosis of  Parkinsonian syndromes.    PATIENT REPORTED OUTCOME MEASURES (PROM): Communication Effectiveness Survey: provided 08/24/23 and asked to bring back next session.                                                                                                                            TREATMENT DATE:   08/24/23: Revisited OT evaluation with pt. He will tell SLP if he is interested in this in the future. SLP ascertained pt practiced approx 7-8 days since last session. SLP reiterated the need for practice as prescribed, and also the rationale for this schedule. Today SLP guided pt through compensations for slower speech and "slowing down" was the means pt told SLP was most natural and achieved the objective. SLP told pt to practice as directed.   07/29/23: discussed possibility of OT eval and benefits with pt. He will tell SLP about this next session. SLP explained how pt should practice at home at this time: read out loud for 30 seconds x10, while recording, then listen back to his recording to incr awareness of faster than WNL speech. Told pt he may see some carryover with this routine.   PATIENT EDUCATION: Education details: see "treatment today" for details Person educated: Patient Education method: Explanation, Demonstration, and Verbal cues Education comprehension: verbalized understanding, returned demonstration, verbal cues required, and needs further education  HOME EXERCISE PROGRAM: See "treatment date"    GOALS: Goals reviewed with patient? Yes in general  SHORT TERM GOALS: Target date: 09/03/23 due to first visit date  Pt will report practicing at home at least 5 days/week for first 2 weeks Baseline: Goal status: INITIAL  2.  Pt will demo slower rate of speech (WNL rate) in sentence responses 18/20 in 2 sessions Baseline:  Goal status: INITIAL  3.  Pt will demo slower rate of speech in 2 minutes of conversation, 80% of the time, in 2 sessions Baseline:  Goal status:  INITIAL  4.  Pt will tell SLP three compensatory measures to use to decr internal tension/stress/anxiety about talking, in 3 sessions Baseline:  Goal status: INITIAL   LONG TERM GOALS: Target date: 10/01/23  Pt will improve PROM Baseline:  Goal status: INITIAL  2.  Pt will demo WNL rate of speech in 10 minutes of mod complex social conversation, 75% of the time, over 3 sessions Baseline:  Goal status: INITIAL  3.  Pt will demo WNL rate of speech in 10 minutes of work-like conversation, 75% of the time, over 3 sessions Baseline:  Goal status: INITIAL  4.  Pt will demo knowledge of 3 websites where to access accurate  information about PD Baseline:  Goal status: INITIAL  4.  Pt will tell SLP he successfully used compensatory measure/s to decr internal tension/stress/anxiety about talking, between 3 sessions Baseline:  Goal status: INITIAL  ASSESSMENT:  CLINICAL IMPRESSION: Patient is a 53 y.o. M who was seen today for treatment of speech and language in light of dx of PD. See "treatment date" above for today's date for further details on today's session. Pt demonstrated multiple repetitions of initial phonemes, syllables, and whole words in conversation today, at the frequency it was distracting to this listener. He conveys to SLP he has conference calls and meetings regularly and is disappointed in how his speech sounds to other people.   OBJECTIVE IMPAIRMENTS: Objective impairments include apraxia. These impairments are limiting patient from household responsibilities, ADLs/IADLs, and effectively communicating at home and in community.Factors affecting potential to achieve goals and functional outcome are medical prognosis and severity of impairments.. Patient will benefit from skilled SLP services to address above impairments and improve overall function.  REHAB POTENTIAL: Excellent  PLAN:  SLP FREQUENCY: 1-2x/week  SLP DURATION: 8 weeks  PLANNED INTERVENTIONS:  Environmental controls, Cueing hierachy, Internal/external aids, Functional tasks, SLP instruction and feedback, Compensatory strategies, Patient/family education, and [id number] Treatment of speech (30 or 45 min)     Naeem Quillin, CCC-SLP 08/24/2023, 5:35 PM

## 2023-08-26 ENCOUNTER — Ambulatory Visit

## 2023-08-26 DIAGNOSIS — R482 Apraxia: Secondary | ICD-10-CM

## 2023-08-26 DIAGNOSIS — R4789 Other speech disturbances: Secondary | ICD-10-CM

## 2023-08-26 NOTE — Therapy (Addendum)
 OUTPATIENT SPEECH LANGUAGE PATHOLOGY PARKINSON'S TREATMENT   Patient Name: Carl Zhang MRN: [id number] DOB:August 28, 1970, 53 y.o., male Today's Date: 08/26/2023  PCP: Arlys Berke, MD REFERRING PROVIDER: Johny Nap, NP  END OF SESSION:  End of Session - 08/26/23 1623     Visit Number 3    Number of Visits 17    Date for SLP Re-Evaluation 10/01/23    SLP Start Time 1622   arrived l ate   SLP Stop Time  1700    SLP Time Calculation (min) 38 min    Activity Tolerance Patient tolerated treatment well           Past Medical History:  Diagnosis Date   GERD (gastroesophageal reflux disease)    Hx of colonoscopy 03/06/2022   Seasonal allergies    Past Surgical History:  Procedure Laterality Date   HEMORRHOID SURGERY     NERVE REPAIR Left 05/21/2015   Procedure: LEFT SMALL FINGER DIGITAL NERVE REPAIR;  Surgeon: Rober Chimera, MD;  Location: [facility];  Service: Orthopedics;  Laterality: Left;   There are no active problems to display for this patient.   ONSET DATE: Diagnosed June 2024; Script dated 07/12/23  REFERRING DIAG: G20.A1 (ICD-10-CM) - Parkinson's disease without dyskinesia or fluctuating manifestations (HCC) R47.82 (ICD-10-CM) - Fluency disorder associated with underlying disease  THERAPY DIAG:  Apraxia of speech  Speech dysfluency  Rationale for Evaluation and Treatment: Rehabilitation  SUBJECTIVE:   SUBJECTIVE STATEMENT: I did 4-5 30-second intervals at work and I though it was helpful.  Pt accompanied by: self  PERTINENT HISTORY: Interval history: Neuro - 07/12/23 Returns today for follow-up visit unaccompanied.  He reports gradual worsening parkinsonism since prior visit.  Reports gradual worsening of RUE tremor, greater difficulty holding onto objects, greater difficulty writing, print is much smaller.  Difficulty typing at work.  Has been using left hand more when able such as using the computer mouse. Started to experience  difficulty with his speech about 6 months ago and noted gradual worsening over the past 3 to 4 months.  He knows what he wants to say but has a hard time getting the word out, stuttering, and hesitancy, worse with anxiety. Feels he needs to speak quicker and rapid in order to get out what he needs to say. Denies any changes in his gait, no recent falls, no swallowing difficulty, denies vision changes. 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 He recently was seen by a personal trainer at [employer] who specializes in Parkinson's but has had a hard time getting visits scheduled as he works 9am-5pm and has 2 young children (3yo and 5yo). He questions pursuing long-term disability.  Reports overall tolerating ropinirole  1mg  well but frequently forgets afternoon dose. He feels like medication is helping but also difficult to fully determine this.  Denies side effects with current dose.  Did discuss possibly pursuing DBS which he is understandably hesitant about but did agree to evaluation/discussion with academic center.  PAIN:  Are you having pain? No  FALLS: Has patient fallen in last 6 months?  No   PATIENT GOALS: I would like to have some tools to help me slow down and think about what I'm going to say.  OBJECTIVE:  Note: Objective measures were completed at Evaluation unless otherwise noted.  DIAGNOSTIC FINDINGS:  [name] results 08/26/22 IMPRESSION: Marked loss of radiotracer activity within the bilateral putamen is a pattern typical  of Parkinson's syndrome pathology.   Of note, [name]  is not diagnostic of Parkinsonian syndromes, which remains a clinical diagnosis. [name]  is an adjuvant test to aid in the clinical diagnosis of Parkinsonian syndromes.    PATIENT REPORTED OUTCOME MEASURES (PROM): Communication Effectiveness Survey: returned 08/26/23. Pt scored himself 19/32, with higher scores indicating more effective  speech in light of pt's deficit areas.                                                                                                                            TREATMENT DATE:   08/26/23: SLP had pt read sentences and focus on using 0.75 speed. Pt was 95% successful with this, x20 sentences. SLP incr'd complexity to paragraphs, with cue to refocus at commas and periods to stay at 0.75 speed, x3. After this practice wth paragraphs, pt's speech for the next 90 seconds had 2 disfluencies. SLP made pt aware of this. Pt strongly encouraged pt to complete 10 30-second readings each day, and also to read to his children using 0.75 speed. He may or may not record this for SLP to listen to. At end of session SLP provided info on Speech Easy device, for pt to review.   08/24/23: Revisited OT evaluation with pt. He will tell SLP if he is interested in this in the future. SLP ascertained pt practiced approx 7-8 days since last session. SLP reiterated the need for practice as prescribed, and also the rationale for this schedule. Today SLP guided pt through compensations for slower speech and slowing down was the means pt told SLP was most natural and achieved the objective. SLP told pt to practice as directed.   07/29/23: discussed possibility of OT eval and benefits with pt. He will tell SLP about this next session. SLP explained how pt should practice at home at this time: read out loud for 30 seconds x10, while recording, then listen back to his recording to incr awareness of faster than WNL speech. Told pt he may see some carryover with this routine.   PATIENT EDUCATION: Education details: see treatment today for details Person educated: Patient Education method: Explanation, Demonstration, and Verbal cues Education comprehension: verbalized understanding, returned demonstration, verbal cues required, and needs further education  HOME EXERCISE PROGRAM: See treatment date    GOALS: Goals reviewed  with patient? Yes in general  SHORT TERM GOALS: Target date: 09/03/23 due to first visit date  Pt will report practicing at home at least 5 days/week for first 2 weeks Baseline: Goal status: INITIAL  2.  Pt will demo slower rate of speech (WNL rate) in sentence responses 18/20 in 2 sessions Baseline:  Goal status: INITIAL  3.  Pt will demo slower rate of speech in 2 minutes of conversation, 80% of the time, in 2 sessions Baseline:  Goal status: INITIAL  4.  Pt will tell SLP three compensatory measures to use to decr internal tension/stress/anxiety about talking, in 3 sessions Baseline:  Goal status: INITIAL   LONG TERM GOALS: Target date: 10/01/23  Pt will improve PROM Baseline:  Goal status: INITIAL  2.  Pt will demo WNL rate of speech in 10 minutes of mod complex social conversation, 75% of the time, over 3 sessions Baseline:  Goal status: INITIAL  3.  Pt will demo WNL rate of speech in 10 minutes of work-like conversation, 75% of the time, over 3 sessions Baseline:  Goal status: INITIAL  4.  Pt will demo knowledge of 3 websites where to access accurate information about PD Baseline:  Goal status: INITIAL  4.  Pt will tell SLP he successfully used compensatory measure/s to decr internal tension/stress/anxiety about talking, between 3 sessions Baseline:  Goal status: INITIAL  ASSESSMENT:  CLINICAL IMPRESSION: Patient is a 53 y.o. M who was seen today for treatment of speech and language in light of dx of PD. See treatment date above for today's date for further details on today's session. Today pt demonstrated multiple repetitions of initial phonemes, syllables, and whole words in conversation to the degree it was distracting to this listener. Alexandre has conference calls and meetings regularly and is disappointed in how his speech sounds to other people.   OBJECTIVE IMPAIRMENTS: Objective impairments include apraxia. These impairments are limiting patient from  household responsibilities, ADLs/IADLs, and effectively communicating at home and in community.Factors affecting potential to achieve goals and functional outcome are medical prognosis and severity of impairments.. Patient will benefit from skilled SLP services to address above impairments and improve overall function.  REHAB POTENTIAL: Excellent  PLAN:  SLP FREQUENCY: 1-2x/week  SLP DURATION: 8 weeks  PLANNED INTERVENTIONS: Environmental controls, Cueing hierachy, Internal/external aids, Functional tasks, SLP instruction and feedback, Compensatory strategies, Patient/family education, and 30865 Treatment of speech (30 or 45 min)     Skyleen Bentley, CCC-SLP 08/26/2023, 4:24 PM

## 2023-08-31 ENCOUNTER — Ambulatory Visit

## 2023-08-31 DIAGNOSIS — R482 Apraxia: Secondary | ICD-10-CM | POA: Diagnosis not present

## 2023-08-31 DIAGNOSIS — R4789 Other speech disturbances: Secondary | ICD-10-CM

## 2023-08-31 NOTE — Therapy (Signed)
 OUTPATIENT SPEECH LANGUAGE PATHOLOGY PARKINSON'S TREATMENT   Patient Name: Carl Zhang MRN: [id number] DOB:[birth year], 53 y.o., male Today's Date: 08/31/2023  PCP: Arlys Berke, MD REFERRING PROVIDER: Johny Nap, NP  END OF SESSION:  End of Session - 08/31/23 1629     Visit Number 4    Number of Visits 17    Date for SLP Re-Evaluation 10/01/23    SLP Start Time 1625    SLP Stop Time  1705    SLP Time Calculation (min) 40 min    Activity Tolerance Patient tolerated treatment well            Past Medical History:  Diagnosis Date   GERD (gastroesophageal reflux disease)    Hx of colonoscopy 03/06/2022   Seasonal allergies    Past Surgical History:  Procedure Laterality Date   HEMORRHOID SURGERY     NERVE REPAIR Left 05/21/2015   Procedure: LEFT SMALL FINGER DIGITAL NERVE REPAIR;  Surgeon: Rober Chimera, MD;  Location: [facility];  Service: Orthopedics;  Laterality: Left;   There are no active problems to display for this patient.   ONSET DATE: Diagnosed June 2024; Script dated 07/12/23  REFERRING DIAG: G20.A1 (ICD-10-CM) - Parkinson's disease without dyskinesia or fluctuating manifestations (HCC) R47.82 (ICD-10-CM) - Fluency disorder associated with underlying disease  THERAPY DIAG:  Apraxia of speech  Speech dysfluency  Rationale for Evaluation and Treatment: Rehabilitation  SUBJECTIVE:   SUBJECTIVE STATEMENT: I did 4-5 30-second intervals at work and I though it was helpful.  Pt accompanied by: self  PERTINENT HISTORY: Interval history: Neuro - 07/12/23 Returns today for follow-up visit unaccompanied.  He reports gradual worsening parkinsonism since prior visit.  Reports gradual worsening of RUE tremor, greater difficulty holding onto objects, greater difficulty writing, print is much smaller.  Difficulty typing at work.  Has been using left hand more when able such as using the computer mouse. Started to experience difficulty with  his speech about 6 months ago and noted gradual worsening over the past 3 to 4 months.  He knows what he wants to say but has a hard time getting the word out, stuttering, and hesitancy, worse with anxiety. Feels he needs to speak quicker and rapid in order to get out what he needs to say. Denies any changes in his gait, no recent falls, no swallowing difficulty, denies vision changes. 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 He recently was seen by a personal trainer at [employer] who specializes in Parkinson's but has had a hard time getting visits scheduled as he works 9am-5pm and has 2 young children (3yo and 5yo). He questions pursuing long-term disability.  Reports overall tolerating ropinirole  1mg  well but frequently forgets afternoon dose. He feels like medication is helping but also difficult to fully determine this.  Denies side effects with current dose.  Did discuss possibly pursuing DBS which he is understandably hesitant about but did agree to evaluation/discussion with academic center.  PAIN:  Are you having pain? No  FALLS: Has patient fallen in last 6 months?  No   PATIENT GOALS: I would like to have some tools to help me slow down and think about what I'm going to say.  OBJECTIVE:  Note: Objective measures were completed at Evaluation unless otherwise noted.  DIAGNOSTIC FINDINGS:  [name] results 08/26/22 IMPRESSION: Marked loss of radiotracer activity within the bilateral putamen is a pattern typical of Parkinson's syndrome  pathology.   Of note, [name]  is not diagnostic of Parkinsonian syndromes, which remains a clinical diagnosis. [name]  is an adjuvant test to aid in the clinical diagnosis of Parkinsonian syndromes.    PATIENT REPORTED OUTCOME MEASURES (PROM): Communication Effectiveness Survey: returned 08/26/23. Pt scored himself 19/32, with higher scores indicating more effective speech in light of  pt's deficit areas.                                                                                                                            TREATMENT DATE:   08/31/23: Pt was unsuccessful at practice times since last session so SLP explained strongly the need to practice. Pt affirmed he could practice BID and SLP assisted pt in generating two times per day he could perform homework - at lunch break and after dinner. SLP to ask pt about this next time. SLP and pt worked on pt slowing rate in reading for 30 seconds. In passages with difficult to pronounce words [place] names) pt had faster rate of speech than with less cognitive demand. Pt also self corrected more often in passages with less cognitive demand.   08/26/23: SLP had pt read sentences and focus on using 0.75 speed. Pt was 95% successful with this, x20 sentences. SLP incr'd complexity to paragraphs, with cue to refocus at commas and periods to stay at 0.75 speed, x3. After this practice wth paragraphs, pt's speech for the next 90 seconds had 2 disfluencies. SLP made pt aware of this. Pt strongly encouraged pt to complete 10 30-second readings each day, and also to read to his children using 0.75 speed. He may or may not record this for SLP to listen to. At end of session SLP provided info on Speech Easy device, for pt to review.   08/24/23: Revisited OT evaluation with pt. He will tell SLP if he is interested in this in the future. SLP ascertained pt practiced approx 7-8 days since last session. SLP reiterated the need for practice as prescribed, and also the rationale for this schedule. Today SLP guided pt through compensations for slower speech and slowing down was the means pt told SLP was most natural and achieved the objective. SLP told pt to practice as directed.   07/29/23: discussed possibility of OT eval and benefits with pt. He will tell SLP about this next session. SLP explained how pt should practice at home at this time: read out  loud for 30 seconds x10, while recording, then listen back to his recording to incr awareness of faster than WNL speech. Told pt he may see some carryover with this routine.   PATIENT EDUCATION: Education details: see treatment today for details Person educated: Patient Education method: Explanation, Demonstration, and Verbal cues Education comprehension: verbalized understanding, returned demonstration, verbal cues required, and needs further education  HOME EXERCISE PROGRAM: See treatment date    GOALS: Goals reviewed with patient? Yes in general  SHORT TERM GOALS: Target date:  09/13/23 (  due to visit number)  Pt will report practicing at home at least 5 days/week for 2 weeks Baseline: Goal status: modified  2.  Pt will demo slower rate of speech (WNL rate) in sentence responses 18/20 in 2 sessions Baseline:  Goal status: INITIAL  3.  Pt will demo slower rate of speech in 2 minutes of conversation, 80% of the time, in 2 sessions Baseline:  Goal status: INITIAL  4.  Pt will tell SLP three compensatory measures to use to decr internal tension/stress/anxiety about talking, in 3 sessions Baseline:  Goal status: INITIAL   LONG TERM GOALS: Target date: 10/01/23  Pt will improve PROM Baseline:  Goal status: INITIAL  2.  Pt will demo WNL rate of speech in 10 minutes of mod complex social conversation, 75% of the time, over 3 sessions Baseline:  Goal status: INITIAL  3.  Pt will demo WNL rate of speech in 10 minutes of work-like conversation, 75% of the time, over 3 sessions Baseline:  Goal status: INITIAL  4.  Pt will demo knowledge of 3 websites where to access accurate information about PD Baseline:  Goal status: INITIAL  4.  Pt will tell SLP he successfully used compensatory measure/s to decr internal tension/stress/anxiety about talking, between 3 sessions Baseline:  Goal status: INITIAL  ASSESSMENT:  CLINICAL IMPRESSION: SL P modified STG date due to visit  number, and STG #1 due to wording (removed first in for first 2 weeks). Patient is a 53 y.o. M who was seen today for treatment of speech and language in light of dx of PD. See treatment date above for today's date for further details on today's session. Today pt again demonstrated multiple repetitions of initial phonemes, syllables, and whole words, and also had blocks in conversation to the degree it was distracting to this listener. Orell has conference calls and meetings regularly and is disappointed in how his speech sounds to other people.   OBJECTIVE IMPAIRMENTS: Objective impairments include apraxia. These impairments are limiting patient from household responsibilities, ADLs/IADLs, and effectively communicating at home and in community.Factors affecting potential to achieve goals and functional outcome are medical prognosis and severity of impairments.. Patient will benefit from skilled SLP services to address above impairments and improve overall function.  REHAB POTENTIAL: Excellent  PLAN:  SLP FREQUENCY: 1-2x/week  SLP DURATION: 8 weeks  PLANNED INTERVENTIONS: Environmental controls, Cueing hierachy, Internal/external aids, Functional tasks, SLP instruction and feedback, Compensatory strategies, Patient/family education, and 16109 Treatment of speech (30 or 45 min)     Ila Landowski, CCC-SLP 08/31/2023, 5:35 PM

## 2023-09-02 ENCOUNTER — Ambulatory Visit

## 2023-09-02 DIAGNOSIS — R4789 Other speech disturbances: Secondary | ICD-10-CM

## 2023-09-02 DIAGNOSIS — R482 Apraxia: Secondary | ICD-10-CM | POA: Diagnosis not present

## 2023-09-02 NOTE — Patient Instructions (Signed)
   SpeechEasy contact  Arther Bill, SLP [phone]

## 2023-09-02 NOTE — Therapy (Signed)
 OUTPATIENT SPEECH LANGUAGE PATHOLOGY PARKINSON'S TREATMENT   Patient Name: Carl Zhang MRN: [id number] DOB:1970-09-13, 53 y.o., male Today's Date: 09/02/2023  PCP: Arlys Berke, MD REFERRING PROVIDER: Johny Nap, NP  END OF SESSION:  End of Session - 09/02/23 1737     Visit Number 5    Number of Visits 17    Date for SLP Re-Evaluation 10/01/23    SLP Start Time 1628   arrived at 1627   SLP Stop Time  1701    SLP Time Calculation (min) 33 min    Activity Tolerance Patient tolerated treatment well             Past Medical History:  Diagnosis Date   GERD (gastroesophageal reflux disease)    Hx of colonoscopy 03/06/2022   Seasonal allergies    Past Surgical History:  Procedure Laterality Date   HEMORRHOID SURGERY     NERVE REPAIR Left 05/21/2015   Procedure: LEFT SMALL FINGER DIGITAL NERVE REPAIR;  Surgeon: Rober Chimera, MD;  Location: [facility];  Service: Orthopedics;  Laterality: Left;   There are no active problems to display for this patient.   ONSET DATE: Diagnosed June 2024; Script dated 07/12/23  REFERRING DIAG: G20.A1 (ICD-10-CM) - Parkinson's disease without dyskinesia or fluctuating manifestations (HCC) R47.82 (ICD-10-CM) - Fluency disorder associated with underlying disease  THERAPY DIAG:  Apraxia of speech  Speech dysfluency  Rationale for Evaluation and Treatment: Rehabilitation  SUBJECTIVE:   SUBJECTIVE STATEMENT: I did it! (Practiced twice yesterday and once today)  Pt accompanied by: self  PERTINENT HISTORY: Interval history: Neuro - 07/12/23 Returns today for follow-up visit unaccompanied.  He reports gradual worsening parkinsonism since prior visit.  Reports gradual worsening of RUE tremor, greater difficulty holding onto objects, greater difficulty writing, print is much smaller.  Difficulty typing at work.  Has been using left hand more when able such as using the computer mouse. Started to experience difficulty  with his speech about 6 months ago and noted gradual worsening over the past 3 to 4 months.  He knows what he wants to say but has a hard time getting the word out, stuttering, and hesitancy, worse with anxiety. Feels he needs to speak quicker and rapid in order to get out what he needs to say. Denies any changes in his gait, no recent falls, no swallowing difficulty, denies vision changes. 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 He recently was seen by a personal trainer at [employer] who specializes in Parkinson's but has had a hard time getting visits scheduled as he works 9am-5pm and has 2 young children (3yo and 5yo). He questions pursuing long-term disability.  Reports overall tolerating ropinirole  1mg  well but frequently forgets afternoon dose. He feels like medication is helping but also difficult to fully determine this.  Denies side effects with current dose.  Did discuss possibly pursuing DBS which he is understandably hesitant about but did agree to evaluation/discussion with academic center.  PAIN:  Are you having pain? No  FALLS: Has patient fallen in last 6 months?  No   PATIENT GOALS: I would like to have some tools to help me slow down and think about what I'm going to say.  OBJECTIVE:  Note: Objective measures were completed at Evaluation unless otherwise noted.  DIAGNOSTIC FINDINGS:  [name] results 08/26/22 IMPRESSION: Marked loss of radiotracer activity within the bilateral putamen is a pattern typical of Parkinson's  syndrome pathology.   Of note, [name]  is not diagnostic of Parkinsonian syndromes, which remains a clinical diagnosis. [name]  is an adjuvant test to aid in the clinical diagnosis of Parkinsonian syndromes.    PATIENT REPORTED OUTCOME MEASURES (PROM): Communication Effectiveness Survey: returned 08/26/23. Pt scored himself 19/32, with higher scores indicating more effective speech in light  of pt's deficit areas.                                                                                                                            TREATMENT DATE:   09/02/23: Pt arrives today with notable more frequent and significant dysfluent behavior than previous sessions. SLP had pt do relaxed breathing for 2 minutes at beginning of practice today. SLP guided pt through reading with intentional, mindful, purposeful speech at x0.75 speed. Pt maintained slower rate without any dysfluencies x4 segments. Aseem asked for the name and phone number of the [employer] rep.  08/31/23: Pt was unsuccessful at practice times since last session so SLP explained strongly the need to practice. Pt affirmed he could practice BID and SLP assisted pt in generating two times per day he could perform homework - at lunch break and after dinner. SLP to ask pt about this next time. SLP and pt worked on pt slowing rate in reading for 30 seconds. In passages with difficult to pronounce words [place] names) pt had faster rate of speech than with less cognitive demand. Pt also self corrected more often in passages with less cognitive demand.   08/26/23: SLP had pt read sentences and focus on using 0.75 speed. Pt was 95% successful with this, x20 sentences. SLP incr'd complexity to paragraphs, with cue to refocus at commas and periods to stay at 0.75 speed, x3. After this practice wth paragraphs, pt's speech for the next 90 seconds had 2 disfluencies. SLP made pt aware of this. Pt strongly encouraged pt to complete 10 30-second readings each day, and also to read to his children using 0.75 speed. He may or may not record this for SLP to listen to. At end of session SLP provided info on [employer] device, for pt to review.   08/24/23: Revisited OT evaluation with pt. He will tell SLP if he is interested in this in the future. SLP ascertained pt practiced approx 7-8 days since last session. SLP reiterated the need for practice  as prescribed, and also the rationale for this schedule. Today SLP guided pt through compensations for slower speech and slowing down was the means pt told SLP was most natural and achieved the objective. SLP told pt to practice as directed.   07/29/23: discussed possibility of OT eval and benefits with pt. He will tell SLP about this next session. SLP explained how pt should practice at home at this time: read out loud for 30 seconds x10, while recording, then listen back to his recording to incr awareness of faster than WNL speech. Told pt he may see  some carryover with this routine.   PATIENT EDUCATION: Education details: see treatment today for details Person educated: Patient Education method: Explanation, Demonstration, and Verbal cues Education comprehension: verbalized understanding, returned demonstration, verbal cues required, and needs further education  HOME EXERCISE PROGRAM: See treatment date    GOALS: Goals reviewed with patient? Yes in general  SHORT TERM GOALS: Target date:  09/13/23 (due to visit number)  Pt will report practicing at home at least 5 days/week for 2 weeks Baseline: Goal status: modified  2.  Pt will demo slower rate of speech (WNL rate) in sentence responses 18/20 in 2 sessions Baseline:  Goal status: INITIAL  3.  Pt will demo slower rate of speech in 2 minutes of conversation, 80% of the time, in 2 sessions Baseline:  Goal status: INITIAL  4.  Pt will tell SLP three compensatory measures to use to decr internal tension/stress/anxiety about talking, in 3 sessions Baseline:  Goal status: INITIAL   LONG TERM GOALS: Target date: 10/01/23  Pt will improve PROM Baseline:  Goal status: INITIAL  2.  Pt will demo WNL rate of speech in 10 minutes of mod complex social conversation, 75% of the time, over 3 sessions Baseline:  Goal status: INITIAL  3.  Pt will demo WNL rate of speech in 10 minutes of work-like conversation, 75% of the time, over  3 sessions Baseline:  Goal status: INITIAL  4.  Pt will demo knowledge of 3 websites where to access accurate information about PD Baseline:  Goal status: INITIAL  4.  Pt will tell SLP he successfully used compensatory measure/s to decr internal tension/stress/anxiety about talking, between 3 sessions Baseline:  Goal status: INITIAL  ASSESSMENT:  CLINICAL IMPRESSION: Patient is a 53 y.o. M who was seen today for treatment of speech and language in light of dx of PD. See treatment date above for today's date for further details on today's session. Today pt again demonstrated multiple repetitions of initial phonemes, syllables, and whole words, and also had blocks in conversation to the degree it was distracting to this listener. Reading with slower rate went well today and he was without any dysfluency. He took the name an dphone number of the [employer] rep. Joon has conference calls and meetings regularly and is disappointed in how his speech sounds to other people.   OBJECTIVE IMPAIRMENTS: Objective impairments include apraxia. These impairments are limiting patient from household responsibilities, ADLs/IADLs, and effectively communicating at home and in community.Factors affecting potential to achieve goals and functional outcome are medical prognosis and severity of impairments.. Patient will benefit from skilled SLP services to address above impairments and improve overall function.  REHAB POTENTIAL: Excellent  PLAN:  SLP FREQUENCY: 1-2x/week  SLP DURATION: 8 weeks  PLANNED INTERVENTIONS: Environmental controls, Cueing hierachy, Internal/external aids, Functional tasks, SLP instruction and feedback, Compensatory strategies, Patient/family education, and [address] Treatment of speech (30 or 45 min)     Lindberg Zenon, CCC-SLP 09/02/2023, 5:37 PM

## 2023-09-07 ENCOUNTER — Ambulatory Visit

## 2023-09-07 DIAGNOSIS — R4789 Other speech disturbances: Secondary | ICD-10-CM

## 2023-09-07 DIAGNOSIS — R482 Apraxia: Secondary | ICD-10-CM | POA: Diagnosis not present

## 2023-09-07 NOTE — Therapy (Signed)
 OUTPATIENT SPEECH LANGUAGE PATHOLOGY PARKINSON'S TREATMENT   Patient Name: Carl Zhang MRN: [id number] DOB:07/22/1970, 53 y.o., male Today's Date: 09/07/2023  PCP: Delayne Artist PARAS, MD REFERRING PROVIDER: Whitfield Raisin, NP  END OF SESSION:  End of Session - 09/07/23 1709     Visit Number 6    Number of Visits 17    Date for SLP Re-Evaluation 10/01/23    SLP Start Time 1620    SLP Stop Time  1700    SLP Time Calculation (min) 40 min    Activity Tolerance Patient tolerated treatment well              Past Medical History:  Diagnosis Date   GERD (gastroesophageal reflux disease)    Hx of colonoscopy 03/06/2022   Seasonal allergies    Past Surgical History:  Procedure Laterality Date   HEMORRHOID SURGERY     NERVE REPAIR Left 05/21/2015   Procedure: LEFT SMALL FINGER DIGITAL NERVE REPAIR;  Surgeon: Alm Hummer, MD;  Location: [facility];  Service: Orthopedics;  Laterality: Left;   There are no active problems to display for this patient.   ONSET DATE: Diagnosed June 2024; Script dated 07/12/23  REFERRING DIAG: G20.A1 (ICD-10-CM) - Parkinson's disease without dyskinesia or fluctuating manifestations (HCC) R47.82 (ICD-10-CM) - Fluency disorder associated with underlying disease  THERAPY DIAG:  Apraxia of speech  Speech dysfluency  Apraxia  Rationale for Evaluation and Treatment: Rehabilitation  SUBJECTIVE:   SUBJECTIVE STATEMENT: I called Carl Zhang today. I have a consultation with her July 9th.  Pt accompanied by: self  PERTINENT HISTORY: Interval history: Neuro - 07/12/23 Returns today for follow-up visit unaccompanied.  He reports gradual worsening parkinsonism since prior visit.  Reports gradual worsening of RUE tremor, greater difficulty holding onto objects, greater difficulty writing, print is much smaller.  Difficulty typing at work.  Has been using left hand more when able such as using the computer mouse. Started to  experience difficulty with his speech about 6 months ago and noted gradual worsening over the past 3 to 4 months.  He knows what he wants to say but has a hard time getting the word out, stuttering, and hesitancy, worse with anxiety. Feels he needs to speak quicker and rapid in order to get out what he needs to say. Denies any changes in his gait, no recent falls, no swallowing difficulty, denies vision changes. 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 He recently was seen by a personal trainer at [employer] who specializes in Parkinson's but has had a hard time getting visits scheduled as he works 9am-5pm and has 2 young children (3yo and 5yo). He questions pursuing long-term disability.  Reports overall tolerating ropinirole  1mg  well but frequently forgets afternoon dose. He feels like medication is helping but also difficult to fully determine this.  Denies side effects with current dose.  Did discuss possibly pursuing DBS which he is understandably hesitant about but did agree to evaluation/discussion with academic center.  PAIN:  Are you having pain? No  FALLS: Has patient fallen in last 6 months?  No   PATIENT GOALS: I would like to have some tools to help me slow down and think about what I'm going to say.  OBJECTIVE:  Note: Objective measures were completed at Evaluation unless otherwise noted.  DIAGNOSTIC FINDINGS:  [name] results 08/26/22 IMPRESSION: Marked loss of radiotracer activity within the bilateral putamen is a pattern  typical of Parkinson's syndrome pathology.   Of note, [name]  is not diagnostic of Parkinsonian syndromes, which remains a clinical diagnosis. [name]  is an adjuvant test to aid in the clinical diagnosis of Parkinsonian syndromes.    PATIENT REPORTED OUTCOME MEASURES (PROM): Communication Effectiveness Survey: returned 08/26/23. Pt scored himself 19/32, with higher scores indicating more  effective speech in light of pt's deficit areas.                                                                                                                            TREATMENT DATE:   09/07/23: Pt has had some sessions of practice between last session and this session, not BID, but more practice than between evaluation and first two ST sessions. SLP engaged pt with practice with reading using slower rate to make slower speech rate more habitual for pt. Pt with average 1 dysfluency per 35-45 second segment, usually after misread words. SLP used digital recorder for pt to listen back and analyze his reasons for dysfluency and faster rushes of speech. SLP then engaged pt in conversation for 1-2 minutes with initial cue to use 0.75 speed. Pt req'd min cues 1/4 segments due to many dysfluencies. SLP told pt after that segment to refocus yourself back to 0.75 speed and pt much more successful in subsequent segments. Pt and SLP to discuss further ST needs at this clinic following his [employer] consultation 09/22/23.  09/02/23: Pt arrives today with notable more frequent and significant dysfluent behavior than previous sessions. SLP had pt do relaxed breathing for 2 minutes at beginning of practice today. SLP guided pt through reading with intentional, mindful, purposeful speech at x0.75 speed. Pt maintained slower rate without any dysfluencies x4 segments. Carl Zhang asked for the name and phone number of the [employer] rep.  08/31/23: Pt was unsuccessful at practice times since last session so SLP explained strongly the need to practice. Pt affirmed he could practice BID and SLP assisted pt in generating two times per day he could perform homework - at lunch break and after dinner. SLP to ask pt about this next time. SLP and pt worked on pt slowing rate in reading for 30 seconds. In passages with difficult to pronounce words [place] names) pt had faster rate of speech than with less cognitive demand. Pt also  self corrected more often in passages with less cognitive demand.   08/26/23: SLP had pt read sentences and focus on using 0.75 speed. Pt was 95% successful with this, x20 sentences. SLP incr'd complexity to paragraphs, with cue to refocus at commas and periods to stay at 0.75 speed, x3. After this practice wth paragraphs, pt's speech for the next 90 seconds had 2 disfluencies. SLP made pt aware of this. Pt strongly encouraged pt to complete 10 30-second readings each day, and also to read to his children using 0.75 speed. He may or may not record this for SLP to listen to. At end of session SLP provided  info on [employer] device, for pt to review.   08/24/23: Revisited OT evaluation with pt. He will tell SLP if he is interested in this in the future. SLP ascertained pt practiced approx 7-8 days since last session. SLP reiterated the need for practice as prescribed, and also the rationale for this schedule. Today SLP guided pt through compensations for slower speech and slowing down was the means pt told SLP was most natural and achieved the objective. SLP told pt to practice as directed.   07/29/23: discussed possibility of OT eval and benefits with pt. He will tell SLP about this next session. SLP explained how pt should practice at home at this time: read out loud for 30 seconds x10, while recording, then listen back to his recording to incr awareness of faster than WNL speech. Told pt he may see some carryover with this routine.   PATIENT EDUCATION: Education details: see treatment today for details Person educated: Patient Education method: Explanation, Demonstration, and Verbal cues Education comprehension: verbalized understanding, returned demonstration, verbal cues required, and needs further education  HOME EXERCISE PROGRAM: See treatment date    GOALS: Goals reviewed with patient? Yes in general  SHORT TERM GOALS: Target date:  09/13/23 (due to visit number)  Pt will report  practicing at home at least 5 days/week for 2 weeks Baseline: Goal status: modified  2.  Pt will demo slower rate of speech (WNL rate) in sentence responses 18/20 in 2 sessions Baseline:  Goal status: INITIAL  3.  Pt will demo slower rate of speech in 2 minutes of conversation, 80% of the time, in 2 sessions Baseline:  Goal status: INITIAL  4.  Pt will tell SLP three compensatory measures to use to decr internal tension/stress/anxiety about talking, in 3 sessions Baseline:  Goal status: INITIAL   LONG TERM GOALS: Target date: 10/01/23  Pt will improve PROM Baseline:  Goal status: INITIAL  2.  Pt will demo WNL rate of speech in 10 minutes of mod complex social conversation, 75% of the time, over 3 sessions Baseline:  Goal status: INITIAL  3.  Pt will demo WNL rate of speech in 10 minutes of work-like conversation, 75% of the time, over 3 sessions Baseline:  Goal status: INITIAL  4.  Pt will demo knowledge of 3 websites where to access accurate information about PD Baseline:  Goal status: INITIAL  4.  Pt will tell SLP he successfully used compensatory measure/s to decr internal tension/stress/anxiety about talking, between 3 sessions Baseline:  Goal status: INITIAL  ASSESSMENT:  CLINICAL IMPRESSION: Patient is a 53 y.o. M who was seen today for treatment of speech and language in light of dx of PD. See treatment date above for today's date for further details on today's session. Today in conversation with SLP pt again demonstrated multiple repetitions of initial phonemes, syllables, and whole words, and also had blocks in conversation to the degree it was distracting to this listener. Lorance has conference calls and meetings regularly and is disappointed in how his speech sounds to other people.   OBJECTIVE IMPAIRMENTS: Objective impairments include apraxia. These impairments are limiting patient from household responsibilities, ADLs/IADLs, and effectively communicating at  home and in community.Factors affecting potential to achieve goals and functional outcome are medical prognosis and severity of impairments.. Patient will benefit from skilled SLP services to address above impairments and improve overall function.  REHAB POTENTIAL: Excellent  PLAN:  SLP FREQUENCY: 1-2x/week  SLP DURATION: 8 weeks  PLANNED INTERVENTIONS: Environmental  controls, Cueing hierachy, Internal/external aids, Functional tasks, SLP instruction and feedback, Compensatory strategies, Patient/family education, and [phone] Treatment of speech (30 or 45 min)     Finbar Nippert, CCC-SLP 09/07/2023, 5:09 PM

## 2023-09-21 ENCOUNTER — Ambulatory Visit: Attending: Adult Health

## 2023-09-21 DIAGNOSIS — R4789 Other speech disturbances: Secondary | ICD-10-CM | POA: Diagnosis present

## 2023-09-21 DIAGNOSIS — R482 Apraxia: Secondary | ICD-10-CM | POA: Insufficient documentation

## 2023-09-21 NOTE — Therapy (Signed)
 OUTPATIENT SPEECH LANGUAGE PATHOLOGY PARKINSON'S TREATMENT   Patient Name: Carl Zhang MRN: [id number] DOB:06-30-1970, 53 y.o., male Today's Date: 09/21/2023  PCP: Carl Zhang REFERRING PROVIDER: Whitfield Raisin, Zhang  END OF SESSION:  End of Session - 09/21/23 2312     Visit Number 7    Number of Visits 17    Date for SLP Re-Evaluation 10/01/23    SLP Start Time 1622    SLP Stop Time  1702    SLP Time Calculation (min) 40 min    Activity Tolerance Patient tolerated treatment well               Past Medical History:  Diagnosis Date   GERD (gastroesophageal reflux disease)    Hx of colonoscopy 03/06/2022   Seasonal allergies    Past Surgical History:  Procedure Laterality Date   HEMORRHOID SURGERY     NERVE REPAIR Left 05/21/2015   Procedure: LEFT SMALL FINGER DIGITAL NERVE REPAIR;  Surgeon: Carl Hummer, Zhang;  Location: [facility];  Service: Orthopedics;  Laterality: Left;   There are no active problems to display for this patient.   ONSET DATE: Diagnosed June 2024; Script dated 07/12/23  REFERRING DIAG: G20.A1 (ICD-10-CM) - Parkinson's disease without dyskinesia or fluctuating manifestations (HCC) R47.82 (ICD-10-CM) - Fluency disorder associated with underlying disease  THERAPY DIAG:  Apraxia of speech  Speech dysfluency  Rationale for Evaluation and Treatment: Rehabilitation  SUBJECTIVE:   SUBJECTIVE STATEMENT: The evaluation with Carl Zhang is now on July 16th.  Pt accompanied by: self  PERTINENT HISTORY: Interval history: Neuro - 07/12/23 Returns today for follow-up visit unaccompanied.  He reports gradual worsening parkinsonism since prior visit.  Reports gradual worsening of RUE tremor, greater difficulty holding onto objects, greater difficulty writing, print is much smaller.  Difficulty typing at work.  Has been using left hand more when able such as using the computer mouse. Started to experience difficulty with his speech  about 6 months ago and noted gradual worsening over the past 3 to 4 months.  He knows what he wants to say but has a hard time getting the word out, stuttering, and hesitancy, worse with anxiety. Feels he needs to speak quicker and rapid in order to get out what he needs to say. Denies any changes in his gait, no recent falls, no swallowing difficulty, denies vision changes. 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 He recently was seen by a personal trainer at [employer] who specializes in Parkinson's but has had a hard time getting visits scheduled as he works 9am-5pm and has 2 young children (3yo and 5yo). He questions pursuing long-term disability.  Reports overall tolerating ropinirole  1mg  well but frequently forgets afternoon dose. He feels like medication is helping but also difficult to fully determine this.  Denies side effects with current dose.  Did discuss possibly pursuing DBS which he is understandably hesitant about but did agree to evaluation/discussion with academic center.  PAIN:  Are you having pain? No  FALLS: Has patient fallen in last 6 months?  No   PATIENT GOALS: I would like to have some tools to help me slow down and think about what I'm going to say.  OBJECTIVE:  Note: Objective measures were completed at Evaluation unless otherwise noted.  DIAGNOSTIC FINDINGS:  [name] results 08/26/22 IMPRESSION: Marked loss of radiotracer activity within the bilateral putamen is a pattern typical of Parkinson's syndrome pathology.  Of note, Carl Zhang  is not diagnostic of Parkinsonian syndromes, which remains a clinical diagnosis. Carl Zhang  is an adjuvant test to aid in the clinical diagnosis of Parkinsonian syndromes.    PATIENT REPORTED OUTCOME MEASURES (PROM): Communication Effectiveness Survey: returned 08/26/23. Pt scored himself 19/32, with higher scores indicating more effective speech in light of pt's deficit  areas.                                                                                                                            TREATMENT DATE:   09/21/23: Debby told SLP he has not practiced since last session. Pt arrived and for first 25 seconds in ST room was 98% fluent. After that, pt's speech became severely dysfluent. Pt stated that fluent speech and dysfluent speech appear to appear randomly like a switch goes on and off. Today SLP worked with pt to make slower and more deliberate/intentional speech more of a habit for pt. In short conversation, SLP cued pt to use overarticulation to achieve fluent speech 90% of the time; SLP had to cue pt second time 20% of the time for overarticulation with an auditory model. Using this technique pt improved fluency to 95%. SLP told pt to practice reading out loud for 30-60 seconds with speech compensations as much as he is able during the day. He is excited to undergo [employer] evaluation next week.    09/07/23: Pt has had some sessions of practice between last session and this session, not BID, but more practice than between evaluation and first two ST sessions. SLP engaged pt with practice with reading using slower rate to make slower speech rate more habitual for pt. Pt with average 1 dysfluency per 35-45 second segment, usually after misread words. SLP used digital recorder for pt to listen back and analyze his reasons for dysfluency and faster rushes of speech. SLP then engaged pt in conversation for 1-2 minutes with initial cue to use 0.75 speed. Pt req'd min cues 1/4 segments due to many dysfluencies. SLP told pt after that segment to refocus yourself back to 0.75 speed and pt much more successful in subsequent segments. Pt and SLP to discuss further ST needs at this clinic following his [employer] consultation 09/22/23.  09/02/23: Pt arrives today with notable more frequent and significant dysfluent behavior than previous sessions. SLP had pt do  relaxed breathing for 2 minutes at beginning of practice today. SLP guided pt through reading with intentional, mindful, purposeful speech at x0.75 speed. Pt maintained slower rate without any dysfluencies x4 segments. Carl Zhang asked for the name and phone number of the [employer] rep.  08/31/23: Pt was unsuccessful at practice times since last session so SLP explained strongly the need to practice. Pt affirmed he could practice BID and SLP assisted pt in generating two times per day he could perform homework - at lunch break and after dinner. SLP to ask pt about this next time. SLP and pt worked on pt slowing rate  in reading for 30 seconds. In passages with difficult to pronounce words [place] names) pt had faster rate of speech than with less cognitive demand. Pt also self corrected more often in passages with less cognitive demand.   08/26/23: SLP had pt read sentences and focus on using 0.75 speed. Pt was 95% successful with this, x20 sentences. SLP incr'd complexity to paragraphs, with cue to refocus at commas and periods to stay at 0.75 speed, x3. After this practice wth paragraphs, pt's speech for the next 90 seconds had 2 disfluencies. SLP made pt aware of this. Pt strongly encouraged pt to complete 10 30-second readings each day, and also to read to his children using 0.75 speed. He may or may not record this for SLP to listen to. At end of session SLP provided info on [employer] device, for pt to review.   08/24/23: Revisited OT evaluation with pt. He will tell SLP if he is interested in this in the future. SLP ascertained pt practiced approx 7-8 days since last session. SLP reiterated the need for practice as prescribed, and also the rationale for this schedule. Today SLP guided pt through compensations for slower speech and slowing down was the means pt told SLP was most natural and achieved the objective. SLP told pt to practice as directed.   07/29/23: discussed possibility of OT eval and  benefits with pt. He will tell SLP about this next session. SLP explained how pt should practice at home at this time: read out loud for 30 seconds x10, while recording, then listen back to his recording to incr awareness of faster than WNL speech. Told pt he may see some carryover with this routine.   PATIENT EDUCATION: Education details: see treatment today for details Person educated: Patient Education method: Explanation, Demonstration, and Verbal cues Education comprehension: verbalized understanding, returned demonstration, verbal cues required, and needs further education  HOME EXERCISE PROGRAM: See treatment date    GOALS: Goals reviewed with patient? Yes in general  SHORT TERM GOALS: Target date:  09/13/23 (due to visit number)  Pt will report practicing at home at least 5 days/week for 2 weeks Baseline: Goal status: not met  2.  Pt will demo slower rate of speech (WNL rate) in sentence responses 18/20 in 2 sessions Baseline:  Goal status: not met  3.  Pt will demo slower rate of speech in 2 minutes of conversation, 80% of the time, in 2 sessions Baseline:  Goal status: not met  4.  Pt will tell SLP three compensatory measures to use to decr internal tension/stress/anxiety about talking, in 3 sessions Baseline:  Goal status: not met   LONG TERM GOALS: Target date: 10/01/23  Pt will improve PROM Baseline:  Goal status: INITIAL  2.  Pt will demo WNL rate of speech in 10 minutes of mod complex social conversation, 75% of the time, over 3 sessions Baseline:  Goal status: INITIAL  3.  Pt will demo WNL rate of speech in 10 minutes of work-like conversation, 75% of the time, over 3 sessions Baseline:  Goal status: INITIAL  4.  Pt will demo knowledge of 3 websites where to access accurate information about PD Baseline:  Goal status: INITIAL  4.  Pt will tell SLP he successfully used compensatory measure/s to decr internal tension/stress/anxiety about talking,  between 3 sessions Baseline:  Goal status: INITIAL  ASSESSMENT:  CLINICAL IMPRESSION: Jarom has had 6 ST sessions since his evaluation 07/29/23. Due to this pt has not met  many of his STGs. Patient is a 53 y.o. M who was seen today for treatment of speech and language in light of dx of PD. See treatment date above for today's date for further details on today's session. Today in conversation with SLP pt again demonstrated multiple repetitions of initial phonemes, syllables, and whole words, and also had blocks in conversation to the degree it was distracting to this listener. When focusing more on deliberate and intentional speech these sx decreased. Gurshaan has conference calls and meetings regularly and is disappointed in how his speech sounds to other people.   OBJECTIVE IMPAIRMENTS: Objective impairments include apraxia. These impairments are limiting patient from household responsibilities, ADLs/IADLs, and effectively communicating at home and in community.Factors affecting potential to achieve goals and functional outcome are medical prognosis and severity of impairments.. Patient will benefit from skilled SLP services to address above impairments and improve overall function.  REHAB POTENTIAL: Excellent  PLAN:  SLP FREQUENCY: 1-2x/week  SLP DURATION: 8 weeks  PLANNED INTERVENTIONS: Environmental controls, Cueing hierachy, Internal/external aids, Functional tasks, SLP instruction and feedback, Compensatory strategies, Patient/family education, and [id number] Treatment of speech (30 or 45 min)     Janeane Cozart, CCC-SLP 09/21/2023, 11:13 PM

## 2023-09-23 ENCOUNTER — Encounter

## 2023-09-28 ENCOUNTER — Ambulatory Visit

## 2023-09-28 DIAGNOSIS — R482 Apraxia: Secondary | ICD-10-CM | POA: Diagnosis not present

## 2023-09-28 DIAGNOSIS — R4789 Other speech disturbances: Secondary | ICD-10-CM

## 2023-09-28 NOTE — Therapy (Signed)
 OUTPATIENT SPEECH LANGUAGE PATHOLOGY PARKINSON'S TREATMENT   Patient Name: Carl Zhang MRN: [id number] DOB:February 20, 1971, 53 y.o., male Today's Date: 09/28/2023  PCP: Delayne Artist PARAS, MD REFERRING PROVIDER: Whitfield Raisin, NP  END OF SESSION:  End of Session - 09/28/23 1710     Visit Number 8    Number of Visits 17    Date for SLP Re-Evaluation 10/01/23    SLP Start Time 1620    SLP Stop Time  1700    SLP Time Calculation (min) 40 min    Activity Tolerance Patient tolerated treatment well                Past Medical History:  Diagnosis Date   GERD (gastroesophageal reflux disease)    Hx of colonoscopy 03/06/2022   Seasonal allergies    Past Surgical History:  Procedure Laterality Date   HEMORRHOID SURGERY     NERVE REPAIR Left 05/21/2015   Procedure: LEFT SMALL FINGER DIGITAL NERVE REPAIR;  Surgeon: Alm Hummer, MD;  Location: [facility];  Service: Orthopedics;  Laterality: Left;   There are no active problems to display for this patient.   ONSET DATE: Diagnosed June 2024; Script dated 07/12/23  REFERRING DIAG: G20.A1 (ICD-10-CM) - Parkinson's disease without dyskinesia or fluctuating manifestations (HCC) R47.82 (ICD-10-CM) - Fluency disorder associated with underlying disease  THERAPY DIAG:  Apraxia of speech  Speech dysfluency  Apraxia  Rationale for Evaluation and Treatment: Rehabilitation  SUBJECTIVE:   SUBJECTIVE STATEMENT: It's been frustrating. I can't seem to slow down when I'm reading.  Pt accompanied by: self  PERTINENT HISTORY: Interval history: Neuro - 07/12/23 Returns today for follow-up visit unaccompanied.  He reports gradual worsening parkinsonism since prior visit.  Reports gradual worsening of RUE tremor, greater difficulty holding onto objects, greater difficulty writing, print is much smaller.  Difficulty typing at work.  Has been using left hand more when able such as using the computer mouse. Started to  experience difficulty with his speech about 6 months ago and noted gradual worsening over the past 3 to 4 months.  He knows what he wants to say but has a hard time getting the word out, stuttering, and hesitancy, worse with anxiety. Feels he needs to speak quicker and rapid in order to get out what he needs to say. Denies any changes in his gait, no recent falls, no swallowing difficulty, denies vision changes. 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 He recently was seen by a personal trainer at [employer] who specializes in Parkinson's but has had a hard time getting visits scheduled as he works 9am-5pm and has 2 young children (3yo and 5yo). He questions pursuing long-term disability.  Reports overall tolerating ropinirole  1mg  well but frequently forgets afternoon dose. He feels like medication is helping but also difficult to fully determine this.  Denies side effects with current dose.  Did discuss possibly pursuing DBS which he is understandably hesitant about but did agree to evaluation/discussion with academic center.  PAIN:  Are you having pain? No  FALLS: Has patient fallen in last 6 months?  No   PATIENT GOALS: I would like to have some tools to help me slow down and think about what I'm going to say.  OBJECTIVE:  Note: Objective measures were completed at Evaluation unless otherwise noted.  DIAGNOSTIC FINDINGS:  [name] results 08/26/22 IMPRESSION: Marked loss of radiotracer activity within the bilateral putamen is a  pattern typical of Parkinson's syndrome pathology.   Of note, [name]  is not diagnostic of Parkinsonian syndromes, which remains a clinical diagnosis. [name]  is an adjuvant test to aid in the clinical diagnosis of Parkinsonian syndromes.    PATIENT REPORTED OUTCOME MEASURES (PROM): Communication Effectiveness Survey: returned 08/26/23. Pt scored himself 19/32, with higher scores indicating more  effective speech in light of pt's deficit areas.                                                                                                                            TREATMENT DATE:   09/28/23: Today, because of pt's S statement, he read paragraphs x4 with 0.75 speed with 95% success with fluent speech. SLP worked with pt with slowing rate of speech in 30-60 second conversational segments with 86% success with fluent speech. Pt self corrected in one opportunity of 13 possible. He stopped, took a breath and began again, unsuccessfully, to attempt fluent speech. SLP stopped pt if his dysfluent speech was increasing in frequency and cued pt to refocus. Pt has [employer] eval tomorrow.  09/21/23: Debby told SLP he has not practiced since last session. Pt arrived and for first 25 seconds in ST room was 98% fluent. After that, pt's speech became severely dysfluent. Pt stated that fluent speech and dysfluent speech appear to appear randomly like a switch goes on and off. Today SLP worked with pt to make slower and more deliberate/intentional speech more of a habit for pt. In short conversation, SLP cued pt to use overarticulation to achieve fluent speech 90% of the time; SLP had to cue pt second time 20% of the time for overarticulation with an auditory model. Using this technique pt improved fluency to 95%. SLP told pt to practice reading out loud for 30-60 seconds with speech compensations as much as he is able during the day. He is excited to undergo [employer] evaluation next week.    09/07/23: Pt has had some sessions of practice between last session and this session, not BID, but more practice than between evaluation and first two ST sessions. SLP engaged pt with practice with reading using slower rate to make slower speech rate more habitual for pt. Pt with average 1 dysfluency per 35-45 second segment, usually after misread words. SLP used digital recorder for pt to listen back and analyze his  reasons for dysfluency and faster rushes of speech. SLP then engaged pt in conversation for 1-2 minutes with initial cue to use 0.75 speed. Pt req'd min cues 1/4 segments due to many dysfluencies. SLP told pt after that segment to refocus yourself back to 0.75 speed and pt much more successful in subsequent segments. Pt and SLP to discuss further ST needs at this clinic following his [employer] consultation 09/22/23.  09/02/23: Pt arrives today with notable more frequent and significant dysfluent behavior than previous sessions. SLP had pt do relaxed breathing for 2 minutes at beginning of practice today. SLP guided pt through  reading with intentional, mindful, purposeful speech at x0.75 speed. Pt maintained slower rate without any dysfluencies x4 segments. Kamani asked for the name and phone number of the [employer] rep.  08/31/23: Pt was unsuccessful at practice times since last session so SLP explained strongly the need to practice. Pt affirmed he could practice BID and SLP assisted pt in generating two times per day he could perform homework - at lunch break and after dinner. SLP to ask pt about this next time. SLP and pt worked on pt slowing rate in reading for 30 seconds. In passages with difficult to pronounce words [place] names) pt had faster rate of speech than with less cognitive demand. Pt also self corrected more often in passages with less cognitive demand.   08/26/23: SLP had pt read sentences and focus on using 0.75 speed. Pt was 95% successful with this, x20 sentences. SLP incr'd complexity to paragraphs, with cue to refocus at commas and periods to stay at 0.75 speed, x3. After this practice wth paragraphs, pt's speech for the next 90 seconds had 2 disfluencies. SLP made pt aware of this. Pt strongly encouraged pt to complete 10 30-second readings each day, and also to read to his children using 0.75 speed. He may or may not record this for SLP to listen to. At end of session SLP provided  info on [employer] device, for pt to review.   08/24/23: Revisited OT evaluation with pt. He will tell SLP if he is interested in this in the future. SLP ascertained pt practiced approx 7-8 days since last session. SLP reiterated the need for practice as prescribed, and also the rationale for this schedule. Today SLP guided pt through compensations for slower speech and slowing down was the means pt told SLP was most natural and achieved the objective. SLP told pt to practice as directed.   07/29/23: discussed possibility of OT eval and benefits with pt. He will tell SLP about this next session. SLP explained how pt should practice at home at this time: read out loud for 30 seconds x10, while recording, then listen back to his recording to incr awareness of faster than WNL speech. Told pt he may see some carryover with this routine.   PATIENT EDUCATION: Education details: see treatment today for details Person educated: Patient Education method: Explanation, Demonstration, and Verbal cues Education comprehension: verbalized understanding, returned demonstration, verbal cues required, and needs further education  HOME EXERCISE PROGRAM: See treatment date    GOALS: Goals reviewed with patient? Yes in general  SHORT TERM GOALS: Target date:  09/13/23 (due to visit number)  Pt will report practicing at home at least 5 days/week for 2 weeks Baseline: Goal status: not met  2.  Pt will demo slower rate of speech (WNL rate) in sentence responses 18/20 in 2 sessions Baseline:  Goal status: not met  3.  Pt will demo slower rate of speech in 2 minutes of conversation, 80% of the time, in 2 sessions Baseline:  Goal status: not met  4.  Pt will tell SLP three compensatory measures to use to decr internal tension/stress/anxiety about talking, in 3 sessions Baseline:  Goal status: not met   LONG TERM GOALS: Target date: 10/01/23  Pt will improve PROM Baseline:  Goal status:  INITIAL  2.  Pt will demo WNL rate of speech in 10 minutes of mod complex social conversation, 75% of the time, over 3 sessions Baseline:  Goal status: INITIAL  3.  Pt will demo  WNL rate of speech in 10 minutes of work-like conversation, 75% of the time, over 3 sessions Baseline:  Goal status: INITIAL  4.  Pt will demo knowledge of 3 websites where to access accurate information about PD Baseline:  Goal status: INITIAL  4.  Pt will tell SLP he successfully used compensatory measure/s to decr internal tension/stress/anxiety about talking, between 3 sessions Baseline:  Goal status: INITIAL  ASSESSMENT:  CLINICAL IMPRESSION: Patient is a 53 y.o. M who was seen today for treatment of speech and language in light of dx of PD. See treatment date above for today's date for further details on today's session. In conversation with SLP pt demonstrated blocks and multiple repetitions. When focusing more on deliberate and intentional speech in 30-90 second segments, these sx decreased. [place] has conference calls and meetings regularly and is disappointed in how his speech sounds to other people.   OBJECTIVE IMPAIRMENTS: Objective impairments include apraxia. These impairments are limiting patient from household responsibilities, ADLs/IADLs, and effectively communicating at home and in community.Factors affecting potential to achieve goals and functional outcome are medical prognosis and severity of impairments.. Patient will benefit from skilled SLP services to address above impairments and improve overall function.  REHAB POTENTIAL: Excellent  PLAN:  SLP FREQUENCY: 1-2x/week  SLP DURATION: 8 weeks  PLANNED INTERVENTIONS: Environmental controls, Cueing hierachy, Internal/external aids, Functional tasks, SLP instruction and feedback, Compensatory strategies, Patient/family education, and [id number] Treatment of speech (30 or 45 min)     Masai Kidd, CCC-SLP 09/28/2023, 5:10 PM

## 2023-09-30 ENCOUNTER — Ambulatory Visit

## 2023-10-01 ENCOUNTER — Encounter: Payer: Self-pay | Admitting: Adult Health

## 2023-10-01 DIAGNOSIS — G20A1 Parkinson's disease without dyskinesia, without mention of fluctuations: Secondary | ICD-10-CM

## 2023-10-04 ENCOUNTER — Ambulatory Visit

## 2023-10-04 DIAGNOSIS — R4789 Other speech disturbances: Secondary | ICD-10-CM

## 2023-10-04 DIAGNOSIS — R482 Apraxia: Secondary | ICD-10-CM

## 2023-10-04 MED ORDER — ROPINIROLE HCL 3 MG PO TABS: 3.0000 mg | ORAL_TABLET | Freq: Every day | ORAL | 1 refills | Status: DC

## 2023-10-04 NOTE — Therapy (Signed)
 OUTPATIENT SPEECH LANGUAGE PATHOLOGY PARKINSON'S TREATMENT   Patient Name: Carl Zhang MRN: [id number] DOB:1970-12-29, 53 y.o., male Today's Date: 10/04/2023  PCP: Carl Artist PARAS, MD REFERRING PROVIDER: Whitfield Raisin, NP  END OF SESSION:  End of Session - 10/04/23 1700     Visit Number 9    Number of Visits 17    Date for SLP Re-Evaluation 10/01/23    SLP Start Time 1620   arrived 1619   SLP Stop Time  1700    SLP Time Calculation (min) 40 min    Activity Tolerance Patient tolerated treatment well                 Past Medical History:  Diagnosis Date   GERD (gastroesophageal reflux disease)    Hx of colonoscopy 03/06/2022   Seasonal allergies    Past Surgical History:  Procedure Laterality Date   HEMORRHOID SURGERY     NERVE REPAIR Left 05/21/2015   Procedure: LEFT SMALL FINGER DIGITAL NERVE REPAIR;  Surgeon: Alm Hummer, MD;  Location: [facility];  Service: Orthopedics;  Laterality: Left;   There are no active problems to display for this patient.  Speech Therapy Progress Note  Dates of Reporting Period: 07/29/23 to present  Subjective Statement: Pt was seen for 9 ST sessions  Objective: Pt's speech fluency has improved in reading - mostly with selections without pt's emotional investment.   Goal Update: See below.  Plan: cont to see pt x2/week x4 weeks  Reason Skilled Services are Required: pt has not yet maxed rehabilitative potential    ONSET DATE: Diagnosed June 2024; Script dated 07/12/23  REFERRING DIAG: G20.A1 (ICD-10-CM) - Parkinson's disease without dyskinesia or fluctuating manifestations (HCC) R47.82 (ICD-10-CM) - Fluency disorder associated with underlying disease  THERAPY DIAG:  Apraxia of speech  Speech dysfluency  Rationale for Evaluation and Treatment: Rehabilitation  SUBJECTIVE:   SUBJECTIVE STATEMENT: It wasn't the magic bullet I was hoping.  Pt accompanied by: self  PERTINENT HISTORY: Interval  history: Neuro - 07/12/23 Returns today for follow-up visit unaccompanied.  He reports gradual worsening parkinsonism since prior visit.  Reports gradual worsening of RUE tremor, greater difficulty holding onto objects, greater difficulty writing, print is much smaller.  Difficulty typing at work.  Has been using left hand more when able such as using the computer mouse. Started to experience difficulty with his speech about 6 months ago and noted gradual worsening over the past 3 to 4 months.  He knows what he wants to say but has a hard time getting the word out, stuttering, and hesitancy, worse with anxiety. Feels he needs to speak quicker and rapid in order to get out what he needs to say. Denies any changes in his gait, no recent falls, no swallowing difficulty, denies vision changes. 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 He recently was seen by a personal trainer at [employer] who specializes in Parkinson's but has had a hard time getting visits scheduled as he works 9am-5pm and has 2 young children (3yo and 5yo). He questions pursuing long-term disability.  Reports overall tolerating ropinirole  1mg  well but frequently forgets afternoon dose. He feels like medication is helping but also difficult to fully determine this.  Denies side effects with current dose.  Did discuss possibly pursuing DBS which he is understandably hesitant about but did agree to evaluation/discussion with academic center.  PAIN:  Are you having pain? No  FALLS: Has patient fallen in last 6 months?  No   PATIENT GOALS: I would like to have some tools to help me slow down and think about what I'm going to say.  OBJECTIVE:  Note: Objective measures were completed at Evaluation unless otherwise noted.  DIAGNOSTIC FINDINGS:  [name] results 08/26/22 IMPRESSION: Marked loss of radiotracer activity within the bilateral putamen is a pattern typical of  Parkinson's syndrome pathology.   Of note, [name]  is not diagnostic of Parkinsonian syndromes, which remains a clinical diagnosis. [name]  is an adjuvant test to aid in the clinical diagnosis of Parkinsonian syndromes.    PATIENT REPORTED OUTCOME MEASURES (PROM): Communication Effectiveness Survey: returned 08/26/23. Pt scored himself 19/32, with higher scores indicating more effective speech in light of pt's deficit areas.                                                                                                                            TREATMENT DATE:   10/04/23: Pt was 89% fluent with Speech Easy device placed during the evaluation. Pt unsure at this time if he is to pursue purchase. He will let SLP know what he chooses. Today, pt shared he has a 5-minute presentation for Monday 10/11/23 in which he has a script largely written and is making a few minor changes here and there. SLP encouraged him to make no more changes after Wednesday/Thursday, and to write cues for making the script sound more natural. Pt does not have access to this script today or SLP would have reviewed it with pt. SLP had pt focus on reducing speech rate to 0.75 with high interest topic of pt - classic [place] cars. SLP req'd pt provide pt with occasional min-mod cues for rate reduction. Pt did not self correct in 8 opportunities to stop fast rate of speech, reset, and restart sentence with 0.75 speed. SLP explained rationale for this to pt, pt voiced understanding. SLP gave homework to pt to practice reading aloud for 30-60 seconds and directions to stop, reset, and restart when he notices his rate of speech increasing.   09/28/23: Today, because of pt's S statement, he read paragraphs x4 with 0.75 speed with 95% success with fluent speech. SLP worked with pt with slowing rate of speech in 30-60 second conversational segments with 86% success with fluent speech. Pt self corrected in one opportunity of 13 possible.  He stopped, took a breath and began again, unsuccessfully, to attempt fluent speech. SLP stopped pt if his dysfluent speech was increasing in frequency and cued pt to refocus. Pt has SpeechEasy eval tomorrow.  09/21/23: Debby told SLP he has not practiced since last session. Pt arrived and for first 25 seconds in ST room was 98% fluent. After that, pt's speech became severely dysfluent. Pt stated that fluent speech and dysfluent speech appear to appear randomly like a switch goes on and off. Today SLP worked with pt to make slower and more deliberate/intentional speech more  of a habit for pt. In short conversation, SLP cued pt to use overarticulation to achieve fluent speech 90% of the time; SLP had to cue pt second time 20% of the time for overarticulation with an auditory model. Using this technique pt improved fluency to 95%. SLP told pt to practice reading out loud for 30-60 seconds with speech compensations as much as he is able during the day. He is excited to undergo SpeechEasy evaluation next week.    09/07/23: Pt has had some sessions of practice between last session and this session, not BID, but more practice than between evaluation and first two ST sessions. SLP engaged pt with practice with reading using slower rate to make slower speech rate more habitual for pt. Pt with average 1 dysfluency per 35-45 second segment, usually after misread words. SLP used digital recorder for pt to listen back and analyze his reasons for dysfluency and faster rushes of speech. SLP then engaged pt in conversation for 1-2 minutes with initial cue to use 0.75 speed. Pt req'd min cues 1/4 segments due to many dysfluencies. SLP told pt after that segment to refocus yourself back to 0.75 speed and pt much more successful in subsequent segments. Pt and SLP to discuss further ST needs at this clinic following his SpeechEasy consultation 09/22/23.  09/02/23: Pt arrives today with notable more frequent and significant  dysfluent behavior than previous sessions. SLP had pt do relaxed breathing for 2 minutes at beginning of practice today. SLP guided pt through reading with intentional, mindful, purposeful speech at x0.75 speed. Pt maintained slower rate without any dysfluencies x4 segments. Tamir asked for the name and phone number of the Speech[employer] rep.  08/31/23: Pt was unsuccessful at practice times since last session so SLP explained strongly the need to practice. Pt affirmed he could practice BID and SLP assisted pt in generating two times per day he could perform homework - at lunch break and after dinner. SLP to ask pt about this next time. SLP and pt worked on pt slowing rate in reading for 30 seconds. In passages with difficult to pronounce words [place] names) pt had faster rate of speech than with less cognitive demand. Pt also self corrected more often in passages with less cognitive demand.   08/26/23: SLP had pt read sentences and focus on using 0.75 speed. Pt was 95% successful with this, x20 sentences. SLP incr'd complexity to paragraphs, with cue to refocus at commas and periods to stay at 0.75 speed, x3. After this practice wth paragraphs, pt's speech for the next 90 seconds had 2 disfluencies. SLP made pt aware of this. Pt strongly encouraged pt to complete 10 30-second readings each day, and also to read to his children using 0.75 speed. He may or may not record this for SLP to listen to. At end of session SLP provided info on Speech Easy device, for pt to review.   08/24/23: Revisited OT evaluation with pt. He will tell SLP if he is interested in this in the future. SLP ascertained pt practiced approx 7-8 days since last session. SLP reiterated the need for practice as prescribed, and also the rationale for this schedule. Today SLP guided pt through compensations for slower speech and slowing down was the means pt told SLP was most natural and achieved the objective. SLP told pt to practice as  directed.   07/29/23: discussed possibility of OT eval and benefits with pt. He will tell SLP about this next session. SLP explained how  pt should practice at home at this time: read out loud for 30 seconds x10, while recording, then listen back to his recording to incr awareness of faster than WNL speech. Told pt he may see some carryover with this routine.   PATIENT EDUCATION: Education details: see treatment today for details Person educated: Patient Education method: Explanation, Demonstration, and Verbal cues Education comprehension: verbalized understanding, returned demonstration, verbal cues required, and needs further education  HOME EXERCISE PROGRAM: See treatment date    GOALS: Goals reviewed with patient? Yes in general  SHORT TERM GOALS: Target date:  09/13/23 (due to visit number)  Pt will report practicing at home at least 5 days/week for 2 weeks Baseline: Goal status: not met  2.  Pt will demo slower rate of speech (WNL rate) in sentence responses 18/20 in 2 sessions Baseline:  Goal status: not met  3.  Pt will demo slower rate of speech in 2 minutes of conversation, 80% of the time, in 2 sessions Baseline:  Goal status: not met  4.  Pt will tell SLP three compensatory measures to use to decr internal tension/stress/anxiety about talking, in 3 sessions Baseline:  Goal status: not met   LONG TERM GOALS: Target date:  11/05/23  Pt will improve PROM Baseline:  Goal status: INITIAL  2.  Pt will demo WNL rate of speech in 10 minutes of mod complex social conversation, 75% of the time, over 3 sessions Baseline:  Goal status: not met and continue  3.  Pt will demo WNL rate of speech in 10 minutes of work-like conversation, 75% of the time, over 3 sessions Baseline:  Goal status: not met and continue  4.  Pt will demo knowledge of 3 websites where to access accurate information about PD Baseline:  Goal status: not met and continue  4.  Pt will tell SLP  he successfully used compensatory measure/s to decr internal tension/stress/anxiety about talking, between 3 sessions Baseline:  Goal status: not met and continue  ASSESSMENT:  CLINICAL IMPRESSION: RECERT TODAY. LTGs not met mainly due to pt's inconsistent practice regimen. He had SpeechEasy eval last week and is undecided yet re: purchasing this device. Patient is a 53 y.o. M who was seen today for treatment of speech and language in light of dx of PD. See treatment date above for today's date for further details on today's session. In conversation with SLP pt demonstrated blocks and multiple repetitions. Ac[place]on has conference calls and meetings regularly and is disappointed in how his speech sounds to other people.   OBJECTIVE IMPAIRMENTS: Objective impairments include apraxia. These impairments are limiting patient from household responsibilities, ADLs/IADLs, and effectively communicating at home and in community.Factors affecting potential to achieve goals and functional outcome are medical prognosis and severity of impairments.. Patient will benefit from skilled SLP services to address above impairments and improve overall function.  REHAB POTENTIAL: Good  PLAN:  SLP FREQUENCY: 2x/week  SLP DURATION: 4 weeks  PLANNED INTERVENTIONS: Environmental controls, Cueing hierachy, Internal/external aids, Functional tasks, SLP instruction and feedback, Compensatory strategies, Patient/family education, and [id number] Treatment of speech (30 or 45 min)     Jewel Mcafee, CCC-SLP 10/04/2023, 5:03 PM

## 2023-10-11 ENCOUNTER — Ambulatory Visit

## 2023-10-18 ENCOUNTER — Ambulatory Visit: Attending: Adult Health

## 2023-10-18 DIAGNOSIS — R4789 Other speech disturbances: Secondary | ICD-10-CM | POA: Insufficient documentation

## 2023-10-18 DIAGNOSIS — R482 Apraxia: Secondary | ICD-10-CM | POA: Insufficient documentation

## 2023-10-18 NOTE — Therapy (Signed)
 [facility] [address] [place], [place], [address] Phone: [phone]   Fax:  [phone]  Patient Details  Name: Carl Zhang MRN: [id number] Date of Birth: 1970-11-20 Referring Provider:   Whitfield Raisin, NP  Encounter Date: 10/18/2023  Speech Therapy (ST) Arrive-Cancel Pt decided against SpeechEasy for the time being. At least not for the next 90 days, he stated. SLP ascertained that pt again had difficulty with regular homework completion and SLP introduced the idea of pt d/c now and returning to ST with new script when he has more margin in his week. Pt acknowledged buying a home, needing to sell a home, two children under age 56, and other familial time commitments were hindering him from putting in the minimum time needed for progress in ST. SLP suggested d/c at this time and pt agreed that was best - that he did not currently have the necessary time to give for the minimum ST practice. SLP invited pt to return when he has 8 weeks of time that he can practice at LEAST 25 minutes, x3/week.    SPEECH THERAPY DISCHARGE SUMMARY  Visits from Start of Care: 9  Current functional level related to goals / functional outcomes: See below. Pt's inconsistent practice made progress challenging.    SHORT TERM GOALS: Target date:  09/13/23 (due to visit number)   Pt will report practicing at home at least 5 days/week for 2 weeks Baseline: Goal status: not met   2.  Pt will demo slower rate of speech (WNL rate) in sentence responses 18/20 in 2 sessions Baseline:  Goal status: not met   3.  Pt will demo slower rate of speech in 2 minutes of conversation, 80% of the time, in 2 sessions Baseline:  Goal status: not met   4.  Pt will tell SLP three compensatory measures to use to decr internal tension/stress/anxiety about talking, in 3 sessions Baseline:  Goal status: not met     LONG TERM GOALS: Target date:  11/05/23   Pt  will improve PROM Baseline:  Goal status: not met   2.  Pt will demo WNL rate of speech in 10 minutes of mod complex social conversation, 75% of the time, over 3 sessions Baseline:  Goal status: not met    3.  Pt will demo WNL rate of speech in 10 minutes of work-like conversation, 75% of the time, over 3 sessions Baseline:  Goal status: not met    4.  Pt will demo knowledge of 3 websites where to access accurate information about PD Baseline:  Goal status: not met    4.  Pt will tell SLP he successfully used compensatory measure/s to decr internal tension/stress/anxiety about talking, between 3 sessions Baseline:  Goal status: not met    ASSESSMENT:   CLINICAL IMPRESSION: LTGs not met mainly due to pt's inconsistent practice regimen. He had SpeechEasy eval last week and is undecided yet re: purchasing this device. Patient is a 53 y.o. M who was seen today for treatment of speech and language in light of dx of PD. See treatment date above for today's date for further details on today's session. In conversation with SLP pt demonstrated blocks and multiple repetitions. Carl Zhang has conference calls and meetings regularly and is disappointed in how his speech sounds to other people.   Remaining deficits: Deficits remain.   Education / Equipment: See individual therapy notes for details.    Patient agrees to  discharge. Patient goals were not met. Patient is being discharged due to the patient's request.     [facility], CCC-SLP 10/18/2023, 4:57 PM  [facility] [address] [place], [place], [address] Phone: [phone]   Fax:  [phone]

## 2023-11-11 ENCOUNTER — Ambulatory Visit: Admitting: Neurology

## 2023-11-17 ENCOUNTER — Encounter: Payer: Self-pay | Admitting: Neurology

## 2023-11-17 ENCOUNTER — Ambulatory Visit (INDEPENDENT_AMBULATORY_CARE_PROVIDER_SITE_OTHER): Admitting: Neurology

## 2023-11-17 VITALS — BP 143/97 | HR 71 | Ht 74.0 in | Wt 216.0 lb

## 2023-11-17 DIAGNOSIS — G4733 Obstructive sleep apnea (adult) (pediatric): Secondary | ICD-10-CM

## 2023-11-17 DIAGNOSIS — G20A1 Parkinson's disease without dyskinesia, without mention of fluctuations: Secondary | ICD-10-CM

## 2023-11-17 MED ORDER — ROPINIROLE HCL ER 4 MG PO TB24: 4.0000 mg | ORAL_TABLET | Freq: Every day | ORAL | 3 refills | Status: AC

## 2023-11-17 NOTE — Patient Instructions (Signed)
 We will stop the immediate release ropinirole  3 mg strength once daily and start you on long-acting ropinirole  4 mg once daily.  Please get in touch with us  via MyChart message in about 3 months and we will consider increasing the ropinirole  extended release to 6 mg once daily at the time.  Follow-up to see Carl Zhang in about 7 months.

## 2023-11-17 NOTE — Telephone Encounter (Signed)
 Checked on status of referral for neurology faxed in April 2025 as requested by Dr. Buck. Contacted [facility]; did not find patient on schedule or referral. \ Refaxing referral for neurology to [facility]. Phone: [phone], Fax: [phone]

## 2023-11-17 NOTE — Progress Notes (Signed)
 Subjective:    Patient ID: Carl Zhang is a 53 y.o. male.  HPI    Interim history:    Carl Zhang is a 53 year old male with an underlying medical history of allergies, reflux disease, hyperlipidemia, and overweight state, who presents for follow-up consultation of his parkinsonism and OSA on AutoPap therapy.  The patient is unaccompanied today.  He was last seen in our clinic by Carl Bogaert, Carl Zhang on 07/12/2023, at which time he reported that his speech was impaired. He was referred to speech therapy.  He wanted to start working with a Systems analyst.  He was advised to switch from ropinirole  IR to ropinirole  long-acting 3 mg each evening.  A referral to a tertiary care center was discussed, particularly to discuss DBS.  A referral was made to [facility]  He was advised to continue to use his AutoPap of 7 to 12 cm consistently.   Today, 11/17/2023: He reports overall doing quite well, trying to be consistent with his AutoPap.  I reviewed his most recent compliance data for the past month, he used his machine 25 out of 30 days and percent use days greater than 4 hours was at 70%, average usage of 5 hours and 53 minutes on days of treatment.  Residual AHI 3.4/h, 95th percentile of pressure at 11.2 cm.  He had several weeks of speech therapy and feels that he really did not benefit much when it comes to the stuttering.  He feels that his stuttering has become worse.  He never was a stutter or growing up but does report that he had intermittent issues with stuttering at times.  He could not get a device to help with his speech as it was too cost prohibitive.  He is still interested in seeing [facility] neurology for DBS consultation, has not heard anything about the consult.  No one has contacted him for an appointment.  I will look into it.  Of note, he is taking ropinirole  IR 3 mg once daily.  He never did start the long-acting version, it does not come in a 3 mg strength, he would be willing to  start the 4 mg strength.  For ease of communication, due to intermittent stuttering, he also wrote down some of his concerns and symptoms for reference which I reviewed on his paper copy.  The patient's allergies, current medications, family history, past medical history, past social history, past surgical history and problem list were reviewed and updated as appropriate.   Previously:  07/12/2023 Carl Bogaert, Carl Zhang): <<Brief HPI:  Carl Zhang is a 53 y.o. male who was initially evaluated by Dr. Buck 07/2022 for RUE trembling, stiffness and achiness since 03/2022.  [name]  consistent with Parkinson's disease and was started on ropinirole  around 09/2022.  He also mentioned symptoms consistent with sleep apnea at follow-up visit in 09/2022 and recommended proceeding with sleep study which was completed in 09/2022 which showed moderate OSA with total AHI 21/h and O2 today of 82%, AutoPap set up 10/2022. At prior visit with Dr. Buck, noted suboptimal compliance due to difficulty tolerating mask and pressure although optimal residual AHI with use, recommend trial of fullface mask F40.  Noted some improvement of tremors on ropinirole , dosage adjusted to 1 mg 3 times daily. Interval history:   Returns today for follow-up visit unaccompanied.  He reports gradual worsening parkinsonism since prior visit.  Reports gradual worsening of RUE tremor, greater difficulty holding onto objects, greater difficulty writing, print is much smaller.  Difficulty typing at work.  Has been using left hand more when able such as using the computer mouse. Started to experience difficulty with his speech about 6 months ago and noted gradual worsening over the past 3 to 4 months.  He knows what he wants to say but has a hard time getting the word out, stuttering, and hesitancy, worse with anxiety. Feels he needs to speak quicker and rapid in order to get out what he needs to say. Denies any changes in his gait, no recent falls, no  swallowing difficulty, denies vision changes. 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 He recently was seen by a personal trainer at [employer] who specializes in Parkinson's but has had a hard time getting visits scheduled as he works 9am-5pm and has 2 Carl children (3yo and 5yo). He questions pursuing long-term disability.  Reports overall tolerating ropinirole  1mg  well but frequently forgets afternoon dose. He feels like medication is helping but also difficult to fully determine this.  Denies side effects with current dose.  Did discuss possibly pursuing DBS which he is understandably hesitant about but did agree to evaluation/discussion with academic center.   CPAP compliance report as below.  Due to length of time discussing Parkinson's disease, was unable to discuss CPAP.   >>   12/22/2022 (SA): I saw him on 09/21/2022, at which time he reported feeling stable.  We talked about his [name]  results.  He was advised to start generic Requip  low-dose with gradual titration.  We talked about his sleep disturbance and the possibility of underlying sleep apnea.  He was advised to proceed with a sleep test.     He had a home sleep test through our office on 10/06/2022 which showed moderate obstructive sleep apnea with an AHI of 21/h, O2 nadir 82% with mild to moderate snoring detected.  He was advised to proceed with home AutoPap therapy.  His set up date was 11/12/2022.  He has any [employer] air sense 11 AutoSet machine.  His DME company is [employer].   I reviewed his AutoPap compliance data from 11/22/2022 through 12/21/2022, which is a total of 30 days, during which time he used his machine 25 days with percent use days greater than 4 hours at 13%, indicating suboptimal compliance, average usage of 2 hours and 46 minutes, residual AHI at goal at 4.7/h, leak on the lower side with the 95th percentile at 4 L/min on a pressure setting of 7 to 12 cm  with EPR of 3, 95th percentile of pressure at 11 cm.  He reports that he is still adjusting to treatment, he has gotten a little bit better with being consistent with putting it on.  He is not quite noticing much in the way of benefit from AutoPap therapy, if anything it is hard for him to tolerate the mask and the pressure.  He has nasal congestion and nasal drainage, sometimes his nose is too stuffy, he uses a fullface mask from [employer], F20, presumably large size.  He uses nasal allergy over-the-counter medications but is planning to be more consistent with the Flonase .  Sometimes he has pooling of saliva in his mask which is annoying.  He is very motivated to not give up on AutoPap therapy and continue with it.  He would not mind trying a different mask, I was able to provide him with a sample pack for the F40 fullface mask from [employer] today.  He reports tolerating the ropinirole , he is up to 0.75 mg 3 times daily.  He does notice that the tremor is a little less.  He has had some nausea in the very beginning but tolerates the medication otherwise, no significant sleepiness noted.  No unwanted behaviors, no evidence of impulse control disorder.  He is agreeable to increasing it to a slightly higher dose.  He has had no significant new symptoms.  His wife has been very supportive thankfully.  He has no trouble pursuing his day-to-day activities, has utilized his left upper extremity a little bit more for certain tasks such as using the mouse at the computer.       I first met him at the request of his primary care physician on 07/27/2022, at which time he reported a several month history of intermittent trembling and stiffness as well as aching sensation in his right upper extremity.  Examination revealed evidence of right-sided predominant parkinsonism.  We discussed this possible diagnosis at length.  He was advised to proceed with a brain [name] .  He had a [name]  on 08/26/2022 and I reviewed the results:  Impression: Marked loss of radiotracer activity within the bilateral putamen is a pattern typical of Parkinson's syndrome pathology.   Of note, [name]  is not diagnostic of Parkinsonian syndromes, which remains a clinical diagnosis. [name]  is an adjuvant test to aid in the clinical diagnosis of Parkinsonian syndromes. He was notified of the test results and offered a follow-up appointment to discuss symptomatic treatment options.       07/27/22: (He) reports intermittent trembling and stiffness as well as aching in his right upper extremity since January 2024.  His pain is not a neck pain and does not radiate from the shoulder or neck down to the arm, it is a forearm achiness, sometimes stiffness and jitteriness.  He feels that he may suppress the tremor sometimes when not painful attention, he has had trouble navigating his computer mouse and has had difficulty with fine motor skills, has found himself doing more things with the nondominant left hand lately.  His handwriting is changed, it has become smaller and more sloppy.  He has not fallen, no balance issues, denies any lightheadedness but does report being a little bit more careful when showering as far as his balance.  He denies any vertiginous symptoms or recurrent headaches, denies any sudden onset of one-sided weakness or numbness or tingling or droopy face or slurring of speech, he denies family history of tremor or Parkinson's disease.  His dad lived to be 72 and had a history of traumatic brain injury, epilepsy and stroke.  He was in a nursing home for the last 10 years of his life.  His mom lived to be 72 and had peritoneal cancer.  He has 2 younger sisters, neither 1 with any tremor issues.  He lives with his wife and 2 Carl children, ages 2 and 39.  He works full-time as a Occupational hygienist.  He is a non-smoker and limits his caffeine to 1 cup of coffee typically, no daily tea or soda.  He tries to hydrate well with water.  He does not  drink any alcohol.  He denies any prodromal illness, had a colonoscopy recently.  He had a bout of bronchitis and concern for pneumonia at the end of last year and beginning of this year.  He was treated with antibiotics and prednisone  as well as an inhaler which he no longer uses.   I reviewed  your office note from 06/09/2022.  He reported right hand pain and tremors since January 2024 at the time.  He has not had any recent blood work.    His Past Medical History Is Significant For: Past Medical History:  Diagnosis Date   GERD (gastroesophageal reflux disease)    Hx of colonoscopy 03/06/2022   Seasonal allergies     His Past Surgical History Is Significant For: Past Surgical History:  Procedure Laterality Date   HEMORRHOID SURGERY     NERVE REPAIR Left 05/21/2015   Procedure: LEFT SMALL FINGER DIGITAL NERVE REPAIR;  Surgeon: Alm Hummer, MD;  Location: [facility];  Service: Orthopedics;  Laterality: Left;    His Family History Is Significant For: Family History  Problem Relation Age of Onset   Parkinson's disease Neg Hx    Tremor Neg Hx     His Social History Is Significant For: Social History   Socioeconomic History   Marital status: Single    Spouse name: Not on file   Number of children: Not on file   Years of education: Not on file   Highest education level: Not on file  Occupational History   Not on file  Tobacco Use   Smoking status: Never   Smokeless tobacco: Never  Vaping Use   Vaping status: Never Used  Substance and Sexual Activity   Alcohol use: No   Drug use: No   Sexual activity: Not on file  Other Topics Concern   Not on file  Social History Narrative   Caffeine: 1-2 cups/day   Right handed   Social Drivers of Health   Financial Resource Strain: Not on file  Food Insecurity: Not on file  Transportation Needs: Not on file  Physical Activity: Not on file  Stress: Not on file  Social Connections: Not on file    His Allergies  Are:  Allergies  Allergen Reactions   Cat Dander   :   His Current Medications Are:  Outpatient Encounter Medications as of 11/17/2023  Medication Sig   cetirizine  (ZYRTEC  ALLERGY) 10 MG tablet Take 1 tablet (10 mg total) by mouth at bedtime.   famotidine (PEPCID) 10 MG tablet Take 10 mg by mouth daily. (Patient taking differently: Take 10 mg by mouth daily. As needed)   fluticasone  (FLONASE ) 50 MCG/ACT nasal spray Place 1 spray into both nostrils daily.   pantoprazole (PROTONIX) 40 MG tablet Take 40 mg by mouth daily. (Patient taking differently: Take 40 mg by mouth daily. As needed)   rOPINIRole  (REQUIP ) 3 MG tablet Take 1 tablet (3 mg total) by mouth at bedtime.   No facility-administered encounter medications on file as of 11/17/2023.  :  Review of Systems:  Out of a complete 14 point review of systems, all are reviewed and negative with the exception of these symptoms as listed below:  Review of Systems  Neurological:        Patient is here alone for parkinsonism and cpap follow-up. Patient states he is doing pretty good overall. ESS 12.    Objective:  Neurological Exam  Physical Exam Physical Examination:   Vitals:   11/17/23 1026  BP: (!) 143/97  Pulse: 71    General Examination: The patient is a very pleasant 53 y.o. male in no acute distress. He appears well-developed and well-nourished and well groomed.   HEENT: Normocephalic, atraumatic, pupils are equal, round and reactive to light, extraocular tracking is still well-preserved.  No obvious saccadic breakdown, no nystagmus noted.  Hearing is grossly intact.  Face is symmetric with slight facial masking noted, neck with mild nuchal rigidity, full range of passive motion, no carotid bruits, all stable.  Airway examination reveals no significant mouth dryness.  Tongue protrudes centrally and palate elevates symmetrically.  No lip, neck or jaw tremor.  Moderate airway crowding/  No significant sialorrhea.  Speech with  fluctuating degrees of stuttering.  No obvious dysarthria.  Mild hypophonia.   Chest: Clear to auscultation without wheezing, rhonchi or crackles noted.   Heart: S1+S2+0, regular and normal without murmurs, rubs or gallops noted.    Abdomen: Soft, non-tender and non-distended.   Extremities: There is no obvious swelling in the distal lower extremities bilaterally.    Skin: Warm and dry without trophic changes noted.    Musculoskeletal: exam reveals no obvious joint deformities.    Neurologically:  Mental status: The patient is awake, alert and oriented in all 4 spheres. His immediate and remote memory, attention, language skills and fund of knowledge are appropriate. There is no evidence of aphasia, agnosia, apraxia or anomia. Speech is clear with normal prosody and enunciation. Thought process is linear. Mood is normal and affect is normal.  Cranial nerves II - XII are as described above under HEENT exam.  Motor exam: Normal bulk, strength noted in all 4 extremities, tone mildly increased in the right wrist, slightly increased in the right elbow area, no significant increase in tone on the left, mild resting tremor in the right upper extremity only.   No other resting tremor noted.  He has a very slight postural tremor in the right upper extremity only.  He has no significant action tremor, no intention tremor, no lower extremity tremor.    (On 07/27/2022: On Archimedes spiral drawing he has no significant trembling, handwriting with the right hand is legible, he can write cursive, not particularly micrographic, not particularly tremulous.)   Fine motor skills and coordination: Mild impairment of finger taps and hand movements as well as rapid alternating patting and foot taps on the right side, normal on the left side in the upper and lower extremities.   Cerebellar testing: No dysmetria or intention tremor. There is no truncal or gait ataxia.  No difficulty with finger-to-nose, no difficulty  with heel-to-shin.   Sensory exam: intact to light touch in the upper and lower extremities.    Gait, station and balance: He stands without difficulty, posture is mildly stooped for age, no telltale shuffling but decreased armswing right more than left.     Assessment and Plan:  In summary, Carl Zhang is a very pleasant 53 year old male with an underlying medical history of allergies, reflux disease, hyperlipidemia, and overweight state, who presents for follow-up consultation of his parkinsonism and OSA on AutoPap therapy.  He is compliant with his AutoPap machine.  He is advised to continue with the current settings and try to be consistent with AutoPap therapy is much as possible.  He is currently on ropinirole  IR 3 mg once daily with good tolerance.  I agree that the once daily formulation would be better for him just so he gets his medication consistently, however, once daily immediate release is probably not good enough for steady state of the medication in his system.  He is advised to switch to ropinirole  ER 4 mg once daily and stop the IR 3 mg once daily for now.  In about 3 months, he is advised to get in touch via MyChart message and at that time  we will consider increasing the dose to 4 mg once daily extended release ropinirole .  He is agreeable to this plan.  We talked about the importance of maintaining a healthy lifestyle again today.  He is advised to follow-up routinely in about 6 to 8 months to see the nurse practitioner again in this clinic.   Of note, his [name]  from 08/26/22 showed marked loss of radiotracer activity within the bilateral putamen, supporting an underlying parkinsonian syndrome.  His home sleep test on 10/06/2022 showed moderate obstructive sleep apnea with an AHI of 21/h, O2 nadir 82% with mild to moderate snoring detected. He has been on AutoPap therapy since late August 2024.  I answered all his questions today and he was in agreement with our plan.   I spent 40  minutes in total face-to-face time and in reviewing records during pre-charting, more than 50% of which was spent in counseling and coordination of care, reviewing test results, reviewing medications and treatment regimen and/or in discussing or reviewing the diagnosis of PD, the prognosis and treatment options. Pertinent laboratory and imaging test results that were available during this visit with the patient were reviewed by me and considered in my medical decision making (see chart for details).

## 2024-04-12 ENCOUNTER — Encounter: Payer: Self-pay | Admitting: Family Medicine

## 2024-04-12 DIAGNOSIS — M7989 Other specified soft tissue disorders: Secondary | ICD-10-CM

## 2024-07-05 ENCOUNTER — Ambulatory Visit: Admitting: Adult Health
